# Patient Record
Sex: Female | Born: 1950 | Race: White | Hispanic: No | Marital: Married | Smoking: Never smoker
Health system: Southern US, Community
[De-identification: ages and names within clinical notes are randomized; demographics above are authoritative.]

## PROBLEM LIST (undated history)

## (undated) DIAGNOSIS — F0390 Unspecified dementia without behavioral disturbance: Secondary | ICD-10-CM

## (undated) DIAGNOSIS — G20 Parkinson's disease: Secondary | ICD-10-CM

## (undated) DIAGNOSIS — G20A1 Parkinson's disease without dyskinesia, without mention of fluctuations: Secondary | ICD-10-CM

## (undated) DIAGNOSIS — J45909 Unspecified asthma, uncomplicated: Secondary | ICD-10-CM

---

## 2004-12-29 ENCOUNTER — Ambulatory Visit: Payer: Self-pay | Admitting: Internal Medicine

## 2005-12-30 ENCOUNTER — Ambulatory Visit: Payer: Self-pay | Admitting: Internal Medicine

## 2007-01-19 ENCOUNTER — Ambulatory Visit: Payer: Self-pay | Admitting: Internal Medicine

## 2009-05-17 ENCOUNTER — Ambulatory Visit: Payer: Self-pay | Admitting: Internal Medicine

## 2010-05-19 ENCOUNTER — Ambulatory Visit: Payer: Self-pay | Admitting: Internal Medicine

## 2011-08-06 ENCOUNTER — Ambulatory Visit: Payer: Self-pay | Admitting: Internal Medicine

## 2011-08-17 ENCOUNTER — Ambulatory Visit: Payer: Self-pay | Admitting: Gastroenterology

## 2011-08-18 LAB — PATHOLOGY REPORT

## 2012-09-23 ENCOUNTER — Ambulatory Visit: Payer: Self-pay | Admitting: Internal Medicine

## 2013-09-25 ENCOUNTER — Ambulatory Visit: Payer: Self-pay | Admitting: Internal Medicine

## 2015-03-25 ENCOUNTER — Other Ambulatory Visit: Payer: Self-pay | Admitting: Internal Medicine

## 2015-03-25 DIAGNOSIS — Z1231 Encounter for screening mammogram for malignant neoplasm of breast: Secondary | ICD-10-CM

## 2015-04-04 ENCOUNTER — Ambulatory Visit: Payer: Self-pay

## 2015-04-17 ENCOUNTER — Ambulatory Visit
Admission: RE | Admit: 2015-04-17 | Discharge: 2015-04-17 | Disposition: A | Discharge status: Home / Self Care | Payer: PRIVATE HEALTH INSURANCE | Source: Ambulatory Visit | Attending: Internal Medicine | Admitting: Internal Medicine

## 2015-04-17 DIAGNOSIS — Z1231 Encounter for screening mammogram for malignant neoplasm of breast: Secondary | ICD-10-CM | POA: Diagnosis present

## 2015-09-27 ENCOUNTER — Emergency Department: Payer: PRIVATE HEALTH INSURANCE

## 2015-09-27 ENCOUNTER — Encounter: Payer: Self-pay | Admitting: Emergency Medicine

## 2015-09-27 ENCOUNTER — Emergency Department
Admission: EM | Admit: 2015-09-27 | Discharge: 2015-09-27 | Disposition: A | Discharge status: Home / Self Care | Payer: PRIVATE HEALTH INSURANCE | Attending: Emergency Medicine | Admitting: Emergency Medicine

## 2015-09-27 DIAGNOSIS — E785 Hyperlipidemia, unspecified: Secondary | ICD-10-CM | POA: Insufficient documentation

## 2015-09-27 DIAGNOSIS — M79604 Pain in right leg: Secondary | ICD-10-CM | POA: Diagnosis not present

## 2015-09-27 DIAGNOSIS — M109 Gout, unspecified: Secondary | ICD-10-CM | POA: Insufficient documentation

## 2015-09-27 DIAGNOSIS — F329 Major depressive disorder, single episode, unspecified: Secondary | ICD-10-CM | POA: Insufficient documentation

## 2015-09-27 DIAGNOSIS — N289 Disorder of kidney and ureter, unspecified: Secondary | ICD-10-CM

## 2015-09-27 DIAGNOSIS — Z880 Allergy status to penicillin: Secondary | ICD-10-CM | POA: Insufficient documentation

## 2015-09-27 DIAGNOSIS — I10 Essential (primary) hypertension: Secondary | ICD-10-CM | POA: Insufficient documentation

## 2015-09-27 DIAGNOSIS — F419 Anxiety disorder, unspecified: Secondary | ICD-10-CM | POA: Diagnosis not present

## 2015-09-27 HISTORY — DX: Unspecified asthma, uncomplicated: J45.909

## 2015-09-27 LAB — CBC WITH DIFFERENTIAL/PLATELET
BASOS ABS: 0.1 10*3/uL (ref 0–0.1)
Basophils Relative: 1 %
Eosinophils Absolute: 0.4 10*3/uL (ref 0–0.7)
Eosinophils Relative: 5 %
HEMATOCRIT: 38.4 % (ref 35.0–47.0)
HEMOGLOBIN: 13 g/dL (ref 12.0–16.0)
LYMPHS PCT: 35 %
Lymphs Abs: 2.9 10*3/uL (ref 1.0–3.6)
MCH: 30.3 pg (ref 26.0–34.0)
MCHC: 33.9 g/dL (ref 32.0–36.0)
MCV: 89.3 fL (ref 80.0–100.0)
Monocytes Absolute: 0.6 10*3/uL (ref 0.2–0.9)
Monocytes Relative: 7 %
NEUTROS ABS: 4.4 10*3/uL (ref 1.4–6.5)
Neutrophils Relative %: 52 %
Platelets: 196 10*3/uL (ref 150–440)
RBC: 4.3 MIL/uL (ref 3.80–5.20)
RDW: 13.6 % (ref 11.5–14.5)
WBC: 8.4 10*3/uL (ref 3.6–11.0)

## 2015-09-27 LAB — BASIC METABOLIC PANEL
ANION GAP: 7 (ref 5–15)
BUN: 24 mg/dL — ABNORMAL HIGH (ref 6–20)
CALCIUM: 9.4 mg/dL (ref 8.9–10.3)
CO2: 29 mmol/L (ref 22–32)
Chloride: 106 mmol/L (ref 101–111)
Creatinine, Ser: 1.2 mg/dL — ABNORMAL HIGH (ref 0.44–1.00)
GFR, EST AFRICAN AMERICAN: 54 mL/min — AB (ref >60)
GFR, EST NON AFRICAN AMERICAN: 46 mL/min — AB (ref >60)
Glucose, Bld: 90 mg/dL (ref 65–99)
POTASSIUM: 3.5 mmol/L (ref 3.5–5.1)
Sodium: 142 mmol/L (ref 135–145)

## 2015-09-27 MED ORDER — TRAMADOL HCL 50 MG PO TABS: 50.0000 mg | ORAL_TABLET | Freq: Four times a day (QID) | ORAL | Status: AC | PRN

## 2015-09-27 MED ORDER — TRAMADOL HCL 50 MG PO TABS
50.0000 mg | ORAL_TABLET | Freq: Once | ORAL | Status: AC
  Administered 2015-09-27: 50 mg via ORAL
  Filled 2015-09-27: qty 1

## 2015-09-27 NOTE — ED Notes (Addendum)
Pt c/o left leg pain is 2/10 when non-weight bearing. With weight on leg pain is 10/10. Pt states she denies any injury to leg that she knows of. C/o of most of the pain in her shin and runs up her leg at times.

## 2015-09-27 NOTE — ED Provider Notes (Signed)
[facility] Emergency Department Provider Note  ____________________________________________  Time seen: Approximately 10:51 AM  I have reviewed the triage vital signs and the nursing notes.   HISTORY  Chief Complaint Leg Pain    HPI Deborah Silva is a 64 y.o. female patient complaining of left leg pain which increases with weightbearing. Patient stated this is anterior left lower leg pain. Patient stated pain as a 2/10 when not weightbearing as soon sleeping but when she wakes up*walking it becomes a 10 over 10. Patient has is that his complaint going on for couple of months. Patient is not discussed this complaint cough family doctor. The only positive for measures for his complaint is a sitting or laying down. Patient denies any shortness of breath or chest pain with this complaint.  Past Medical History  Diagnosis Date  . Asthma     There are no active problems to display for this patient.   No past surgical history on file.  No current outpatient prescriptions on file.  Allergies Penicillins and Theophyllines  No family history on file.  Social History Social History  Substance Use Topics  . Smoking status: Never Smoker   . Smokeless tobacco: None  . Alcohol Use: No    Review of Systems Constitutional: No fever/chills Eyes: No visual changes. ENT: No sore throat. Cardiovascular: Denies chest pain. Respiratory: Denies shortness of breath. Gastrointestinal: No abdominal pain.  No nausea, no vomiting.  No diarrhea.  No constipation. Genitourinary: Negative for dysuria. Musculoskeletal: Negative for back pain. Skin: Negative for rash. Neurological: Negative for headaches, focal weakness or numbness. Psychiatric:Anxiety and depression. Endocrine:Hyperlipidemia, hypertension, and gout Hematological/Lymphatic: Allergic/Immunilogical: See medication list. 10-point ROS otherwise  negative.  ____________________________________________   PHYSICAL EXAM:  VITAL SIGNS: ED Triage Vitals  Enc Vitals Group     BP --      Pulse --      Resp --      Temp --      Temp src --      SpO2 --      Weight 09/27/15 1040 193 lb (87.544 kg)     Height 09/27/15 1040 5\' 4"  (1.626 m)     Head Cir --      Peak Flow --      Pain Score 09/27/15 1026 1     Pain Loc --      Pain Edu? --      Excl. in GC? --     Constitutional: Alert and oriented. Well appearing and in no acute distress. Eyes: Conjunctivae are normal. PERRL. EOMI. Head: Atraumatic. Nose: No congestion/rhinnorhea. Mouth/Throat: Mucous membranes are moist.  Oropharynx non-erythematous. Neck: No stridor.  No cervical spine tenderness to palpation. *Hematological/Lymphatic/Immunilogical: No cervical lymphadenopathy. **}Cardiovascular: Normal rate, regular rhythm. Grossly normal heart sounds.  Good peripheral circulation. Respiratory: Normal respiratory effort.  No retractions. Lungs CTAB. Gastrointestinal: Soft and nontender. No distention. No abdominal bruits. No CVA tenderness. Musculoskeletal: No lower extremity tenderness nor edema.  No joint effusions. Neurologic:  Normal speech and language. No gross focal neurologic deficits are appreciated. No gait instability. Skin:  Skin is warm, dry and intact. No rash noted. Psychiatric: Mood and affect are normal. Speech and behavior are normal.  ____________________________________________   LABS (all labs ordered are listed, but only abnormal results are displayed)  Labs Reviewed  BASIC METABOLIC PANEL - Abnormal; Notable for the following:    BUN 24 (*)    Creatinine, Ser 1.20 (*)    GFR calc  non Af Amer 46 (*)    GFR calc Af Amer 54 (*)    All other components within normal limits  CBC WITH DIFFERENTIAL/PLATELET   ____________________________________________  EKG   ____________________________________________  RADIOLOGY  No acute findings on  x-ray. ____________________________________________   PROCEDURES  Procedure(s) performed: None  Critical Care performed: No  ____________________________________________   INITIAL IMPRESSION / ASSESSMENT AND PLAN / ED COURSE  Pertinent labs & imaging results that were available during my care of the patient were reviewed by me and considered in my medical decision making (see chart for details).  Right leg pain and mild renal insufficiency. Discuss x-ray and lab results with patient. Patient will follow-up with scheduled doctor's appointment next week. Patient given a prescription for tramadol. ____________________________________________   FINAL CLINICAL IMPRESSION(S) / ED DIAGNOSES  Final diagnoses:  Leg pain, anterior, right  Renal insufficiency, mild       Joni Reining, PA-C 09/27/15 1201  Arnaldo Natal, MD 09/28/15 [phone]

## 2015-09-27 NOTE — ED Notes (Signed)
Leg pain with weigth bearing--shin area.  No injury recalled.

## 2016-04-07 ENCOUNTER — Other Ambulatory Visit: Payer: Self-pay | Admitting: Internal Medicine

## 2016-04-07 DIAGNOSIS — Z1231 Encounter for screening mammogram for malignant neoplasm of breast: Secondary | ICD-10-CM

## 2016-04-23 ENCOUNTER — Other Ambulatory Visit: Payer: Self-pay | Admitting: Internal Medicine

## 2016-04-23 ENCOUNTER — Ambulatory Visit
Admission: RE | Admit: 2016-04-23 | Discharge: 2016-04-23 | Disposition: A | Discharge status: Home / Self Care | Payer: Medicare Other | Source: Ambulatory Visit | Attending: Internal Medicine | Admitting: Internal Medicine

## 2016-04-23 DIAGNOSIS — Z1231 Encounter for screening mammogram for malignant neoplasm of breast: Secondary | ICD-10-CM

## 2016-06-26 ENCOUNTER — Other Ambulatory Visit: Payer: Self-pay | Admitting: Internal Medicine

## 2016-06-26 DIAGNOSIS — R270 Ataxia, unspecified: Secondary | ICD-10-CM

## 2016-07-14 ENCOUNTER — Ambulatory Visit
Admission: RE | Admit: 2016-07-14 | Discharge: 2016-07-14 | Disposition: A | Discharge status: Home / Self Care | Payer: Medicare Other | Source: Ambulatory Visit | Attending: Internal Medicine | Admitting: Internal Medicine

## 2016-07-14 DIAGNOSIS — R270 Ataxia, unspecified: Secondary | ICD-10-CM

## 2016-07-23 ENCOUNTER — Ambulatory Visit
Admission: RE | Admit: 2016-07-23 | Discharge: 2016-07-23 | Disposition: A | Discharge status: Home / Self Care | Payer: Medicare Other | Source: Ambulatory Visit | Attending: Internal Medicine | Admitting: Internal Medicine

## 2016-07-23 ENCOUNTER — Encounter: Payer: Self-pay | Admitting: Radiology

## 2016-07-23 ENCOUNTER — Other Ambulatory Visit
Admission: RE | Admit: 2016-07-23 | Discharge: 2016-07-23 | Disposition: A | Discharge status: Home / Self Care | Payer: Medicare Other | Source: Ambulatory Visit | Attending: Internal Medicine | Admitting: Internal Medicine

## 2016-07-23 DIAGNOSIS — I10 Essential (primary) hypertension: Secondary | ICD-10-CM | POA: Diagnosis present

## 2016-07-23 DIAGNOSIS — R251 Tremor, unspecified: Secondary | ICD-10-CM | POA: Diagnosis present

## 2016-07-23 DIAGNOSIS — R9082 White matter disease, unspecified: Secondary | ICD-10-CM | POA: Diagnosis not present

## 2016-07-23 DIAGNOSIS — R270 Ataxia, unspecified: Secondary | ICD-10-CM | POA: Diagnosis present

## 2016-07-23 LAB — CREATININE, SERUM
CREATININE: 1.1 mg/dL — AB (ref 0.44–1.00)
GFR calc Af Amer: 60 mL/min — ABNORMAL LOW (ref >60)
GFR calc non Af Amer: 52 mL/min — ABNORMAL LOW (ref >60)

## 2016-07-23 LAB — BUN: BUN: 21 mg/dL — ABNORMAL HIGH (ref 6–20)

## 2016-07-23 MED ORDER — GADOBENATE DIMEGLUMINE 529 MG/ML IV SOLN
20.0000 mL | Freq: Once | INTRAVENOUS | Status: AC | PRN
  Administered 2016-07-23: 18 mL via INTRAVENOUS

## 2017-06-25 ENCOUNTER — Other Ambulatory Visit: Payer: Self-pay | Admitting: Internal Medicine

## 2017-06-25 DIAGNOSIS — Z1231 Encounter for screening mammogram for malignant neoplasm of breast: Secondary | ICD-10-CM

## 2017-07-26 ENCOUNTER — Ambulatory Visit
Admission: RE | Admit: 2017-07-26 | Discharge: 2017-07-26 | Disposition: A | Discharge status: Home / Self Care | Payer: Medicare Other | Source: Ambulatory Visit | Attending: Internal Medicine | Admitting: Internal Medicine

## 2017-07-26 DIAGNOSIS — Z1231 Encounter for screening mammogram for malignant neoplasm of breast: Secondary | ICD-10-CM | POA: Insufficient documentation

## 2018-06-29 ENCOUNTER — Other Ambulatory Visit: Payer: Self-pay | Admitting: Internal Medicine

## 2018-06-29 DIAGNOSIS — Z1231 Encounter for screening mammogram for malignant neoplasm of breast: Secondary | ICD-10-CM

## 2018-08-01 ENCOUNTER — Ambulatory Visit
Admission: RE | Admit: 2018-08-01 | Discharge: 2018-08-01 | Disposition: A | Discharge status: Home / Self Care | Payer: Medicare Other | Source: Ambulatory Visit | Attending: Internal Medicine | Admitting: Internal Medicine

## 2018-08-01 DIAGNOSIS — Z1231 Encounter for screening mammogram for malignant neoplasm of breast: Secondary | ICD-10-CM | POA: Diagnosis not present

## 2018-10-14 ENCOUNTER — Emergency Department: Payer: Medicare Other

## 2018-10-14 ENCOUNTER — Emergency Department
Admission: EM | Admit: 2018-10-14 | Discharge: 2018-10-14 | Disposition: A | Discharge status: Home / Self Care | Payer: Medicare Other | Attending: Emergency Medicine | Admitting: Emergency Medicine

## 2018-10-14 DIAGNOSIS — R1110 Vomiting, unspecified: Secondary | ICD-10-CM | POA: Diagnosis present

## 2018-10-14 DIAGNOSIS — J45909 Unspecified asthma, uncomplicated: Secondary | ICD-10-CM | POA: Insufficient documentation

## 2018-10-14 LAB — COMPREHENSIVE METABOLIC PANEL
ALT: 17 U/L (ref 0–44)
ANION GAP: 6 (ref 5–15)
AST: 23 U/L (ref 15–41)
Albumin: 4 g/dL (ref 3.5–5.0)
Alkaline Phosphatase: 52 U/L (ref 38–126)
BILIRUBIN TOTAL: 0.8 mg/dL (ref 0.3–1.2)
BUN: 22 mg/dL (ref 8–23)
CALCIUM: 8.7 mg/dL — AB (ref 8.9–10.3)
CO2: 26 mmol/L (ref 22–32)
CREATININE: 0.9 mg/dL (ref 0.44–1.00)
Chloride: 106 mmol/L (ref 98–111)
Glucose, Bld: 132 mg/dL — ABNORMAL HIGH (ref 70–99)
Potassium: 3.8 mmol/L (ref 3.5–5.1)
Sodium: 138 mmol/L (ref 135–145)
TOTAL PROTEIN: 6.8 g/dL (ref 6.5–8.1)

## 2018-10-14 LAB — CBC
HCT: 38.4 % (ref 36.0–46.0)
Hemoglobin: 12.4 g/dL (ref 12.0–15.0)
MCH: 29.5 pg (ref 26.0–34.0)
MCHC: 32.3 g/dL (ref 30.0–36.0)
MCV: 91.4 fL (ref 80.0–100.0)
NRBC: 0 % (ref 0.0–0.2)
PLATELETS: 155 10*3/uL (ref 150–400)
RBC: 4.2 MIL/uL (ref 3.87–5.11)
RDW: 13.5 % (ref 11.5–15.5)
WBC: 7.2 10*3/uL (ref 4.0–10.5)

## 2018-10-14 LAB — TROPONIN I

## 2018-10-14 LAB — MAGNESIUM: Magnesium: 2.2 mg/dL (ref 1.7–2.4)

## 2018-10-14 LAB — LIPASE, BLOOD: Lipase: 41 U/L (ref 11–51)

## 2018-10-14 MED ORDER — ONDANSETRON 4 MG PO TBDP: ORAL_TABLET | ORAL | 0 refills | Status: DC

## 2018-10-14 NOTE — ED Provider Notes (Signed)
[facility] Emergency Department Provider Note  ____________________________________________   First MD Initiated Contact with Patient 10/14/18 [phone]     (approximate)  I have reviewed the triage vital signs and the nursing notes.   HISTORY  Chief Complaint No chief complaint on file.    HPI Deborah Silva is a 67 y.o. female with no contributory past medical history who presents for evaluation of persistent vomiting.  She reports that the onset was acute and nothing in particular makes it better or worse other than trying to eat or drink anything.  She denies having any chest pain or shortness of breath.  She denies recent fever/chills and even denies abdominal pain in spite of the nausea and vomiting.  She has also had no recent dysuria.  She has not had a history of bowel obstruction or ileus.  She reports that she has had some heartburn for several days that has been coming and going but has not been persistent and she currently does not feel any.  She is feeling better after 4 mg of Zofran IV.  The now her vomiting were severe but have improved significantly.  Past Medical History:  Diagnosis Date  . Asthma     There are no active problems to display for this patient.   History reviewed. No pertinent surgical history.  Prior to Admission medications   Medication Sig Start Date End Date Taking? Authorizing Provider  ondansetron (ZOFRAN ODT) 4 MG disintegrating tablet Allow 1-2 tablets to dissolve in your mouth every 8 hours as needed for nausea/vomiting 10/14/18   Loleta Rose, MD    Allergies Penicillins and Theophyllines  Family History  Problem Relation Age of Onset  . Breast cancer Neg Hx     Social History Social History   Tobacco Use  . Smoking status: Never Smoker  . Smokeless tobacco: Never Used  Substance Use Topics  . Alcohol use: No  . Drug use: Not on file    Review of Systems Constitutional: No fever/chills Eyes: No  visual changes. ENT: No sore throat. Cardiovascular: Denies chest pain. Respiratory: Denies shortness of breath. Gastrointestinal: No abdominal pain.  Nausea and vomiting since last night.  No diarrhea.  No constipation. Genitourinary: Negative for dysuria. Musculoskeletal: Negative for neck pain.  Negative for back pain. Integumentary: Negative for rash. Neurological: Negative for headaches, focal weakness or numbness.   ____________________________________________   PHYSICAL EXAM:  VITAL SIGNS: ED Triage Vitals  Enc Vitals Group     BP 10/14/18 0409 (!) 158/67     Pulse Rate 10/14/18 0409 80     Resp 10/14/18 0409 18     Temp 10/14/18 0409 98.1 F (36.7 C)     Temp Source 10/14/18 0409 Oral     SpO2 10/14/18 0409 95 %     Weight 10/14/18 0410 88 kg (194 lb 0.1 oz)     Height --      Head Circumference --      Peak Flow --      Pain Score 10/14/18 0410 0     Pain Loc --      Pain Edu? --      Excl. in GC? --     Constitutional: Alert and oriented. Well appearing and in no acute distress. Eyes: Conjunctivae are normal.  Head: Atraumatic. Nose: No congestion/rhinnorhea. Mouth/Throat: Mucous membranes are moist. Neck: No stridor.  No meningeal signs.   Cardiovascular: Normal rate, regular rhythm. Good peripheral circulation. Grossly normal heart sounds.  Respiratory: Normal respiratory effort.  No retractions. Lungs CTAB. Gastrointestinal: Soft and nontender. No distention.  Musculoskeletal: No lower extremity tenderness nor edema. No gross deformities of extremities. Neurologic:  Normal speech and language. No gross focal neurologic deficits are appreciated.  Skin:  Skin is warm, dry and intact. No rash noted. Psychiatric: Mood and affect are normal. Speech and behavior are normal.  ____________________________________________   LABS (all labs ordered are listed, but only abnormal results are displayed)  Labs Reviewed  COMPREHENSIVE METABOLIC PANEL - Abnormal;  Notable for the following components:      Result Value   Glucose, Bld 132 (*)    Calcium 8.7 (*)    All other components within normal limits  LIPASE, BLOOD  CBC  TROPONIN I  MAGNESIUM  URINALYSIS, COMPLETE (UACMP) WITH MICROSCOPIC   ____________________________________________  EKG  ED ECG REPORT #1 I, Loleta Rose, the attending physician, personally viewed and interpreted this ECG.  Date: 10/14/2018 EKG Time: 4:16 AM Rate: 92 Rhythm: Atrial  with occasional PVC QRS Axis: Left axis deviation Intervals: normal ST/T Wave abnormalities: Non-specific ST segment / T-wave changes, but no evidence of acute ischemia. Narrative Interpretation: no evidence of acute ischemia  ED ECG REPORT #2 I, Loleta Rose, the attending physician, personally viewed and interpreted this ECG.  Date: 10/14/2018 EKG Time: 05:07 Rate: 76 Rhythm: normal sinus rhythm QRS Axis: normal Intervals: normal ST/T Wave abnormalities: Non-specific ST segment / T-wave changes, but no evidence of acute ischemia. Narrative Interpretation: no evidence of acute ischemia    ____________________________________________  RADIOLOGY I, Loleta Rose, personally viewed and evaluated these images (plain radiographs) as part of my medical decision making, as well as reviewing the written report by the radiologist.  ED MD interpretation: No active disease or acute intrathoracic abnormality  Official radiology report(s): Dg Chest Portable 1 View  Result Date: 10/14/2018 CLINICAL DATA:  67 y/o F; multiple emesis beginning last night. Heartburn. EXAM: PORTABLE CHEST 1 VIEW COMPARISON:  None. FINDINGS: The heart size and mediastinal contours are within normal limits. Both lungs are clear. The visualized skeletal structures are unremarkable. IMPRESSION: No active disease. Electronically Signed   By: Mitzi Hansen M.D.   On: 10/14/2018 05:39   Dg Abd 2 Views  Result Date: 10/14/2018 CLINICAL DATA:  Vomiting  EXAM: ABDOMEN - 2 VIEW COMPARISON:  None. FINDINGS: The bowel gas pattern is normal. There is no evidence of free air. No radio-opaque calculi or other significant radiographic abnormality is seen. IMPRESSION: Negative. Electronically Signed   By: Marlan Palau M.D.   On: 10/14/2018 06:38    ____________________________________________   PROCEDURES  Critical Care performed: No   Procedure(s) performed:   Procedures   ____________________________________________   INITIAL IMPRESSION / ASSESSMENT AND PLAN / ED COURSE  As part of my medical decision making, I reviewed the following data within the electronic MEDICAL RECORD NUMBER Nursing notes reviewed and incorporated, Labs reviewed , EKG interpreted , Old EKG reviewed, Old chart reviewed and Notes from prior ED visits    Differential diagnosis includes, but is not limited to, viral gastritis, foodborne infection, SBO/ileus, ACS, pancreatitis.  The patient felt much better after Zofran and was laughing and joking with me.  Her vital signs have been stable the whole time with no tachycardia no evidence of volume depletion in spite of her symptoms.  I obtained radiographs of chest and abdomen and there are no signs of dilated bowel loops or air-fluid levels.  I observed her for 3 to 4  hours and she had no recurrence of her symptoms and was able to sleep comfortably and was feeling well when she awoke.  I consider getting a repeat troponin I explained to the patient and her husband why I was discussing it, but they are comfortable not checking a second troponin and she just wants to go home to sleep.  She assures me she will follow-up with her regular doctor and return to the hospital if her symptoms get worse, and I think that is appropriate.  I gave strict return precautions and they understand and agree with the plan.  I provided a prescription for Zofran.     ____________________________________________  FINAL CLINICAL IMPRESSION(S) / ED  DIAGNOSES  Final diagnoses:  Vomiting     MEDICATIONS GIVEN DURING THIS VISIT:  Medications - No data to display   ED Discharge Orders         Ordered    ondansetron (ZOFRAN ODT) 4 MG disintegrating tablet     10/14/18 0745           Note:  This document was prepared using Dragon voice recognition software and may include unintentional dictation errors.    Loleta RoseForbach, Palak Tercero, MD 10/14/18 [phone]

## 2018-10-14 NOTE — ED Notes (Signed)
Portable chest XR at bedside

## 2018-10-14 NOTE — ED Triage Notes (Signed)
Patient c/o multiple emeses beginning last night. Patient denies diarrhea. Patient denies pain.   Patient reports heartburn X several days - denies pain currently.

## 2018-10-14 NOTE — Discharge Instructions (Addendum)
Your workup in the Emergency Department today was reassuring.  We did not find any specific abnormalities.  We recommend you drink plenty of fluids, take your regular medications and/or any new ones prescribed today, and follow up with the doctor(s) listed in these documents as recommended.  Return to the Emergency Department if you develop new or worsening symptoms that concern you, including but not limited to persistent vomiting, chest pain, shortness of breath, palpitations, etc.

## 2018-10-14 NOTE — ED Notes (Signed)
Pt off the floor at this time in XR.

## 2018-10-14 NOTE — ED Notes (Signed)
Introduced to patient, initial assessment completed. Pt placed on cardiac, NIB, and SPO2 monitoring. Pt updated on POC, call bell within reach, husband at bedside.

## 2019-03-24 ENCOUNTER — Other Ambulatory Visit: Payer: Self-pay | Admitting: Internal Medicine

## 2019-03-24 ENCOUNTER — Other Ambulatory Visit (HOSPITAL_COMMUNITY): Payer: Self-pay | Admitting: Internal Medicine

## 2019-03-24 DIAGNOSIS — G20 Parkinson's disease: Secondary | ICD-10-CM

## 2019-03-24 DIAGNOSIS — R441 Visual hallucinations: Secondary | ICD-10-CM

## 2019-03-24 DIAGNOSIS — R410 Disorientation, unspecified: Secondary | ICD-10-CM

## 2019-03-29 ENCOUNTER — Other Ambulatory Visit: Payer: Self-pay

## 2019-03-29 ENCOUNTER — Ambulatory Visit
Admission: RE | Admit: 2019-03-29 | Discharge: 2019-03-29 | Disposition: A | Discharge status: Home / Self Care | Payer: Medicare Other | Source: Ambulatory Visit | Attending: Internal Medicine | Admitting: Internal Medicine

## 2019-03-29 DIAGNOSIS — G20 Parkinson's disease: Secondary | ICD-10-CM | POA: Diagnosis not present

## 2019-03-29 DIAGNOSIS — R410 Disorientation, unspecified: Secondary | ICD-10-CM

## 2019-03-29 DIAGNOSIS — R441 Visual hallucinations: Secondary | ICD-10-CM | POA: Diagnosis present

## 2019-05-17 ENCOUNTER — Emergency Department: Payer: Medicare Other

## 2019-05-17 ENCOUNTER — Other Ambulatory Visit: Payer: Self-pay

## 2019-05-17 ENCOUNTER — Inpatient Hospital Stay
Admission: EM | Admit: 2019-05-17 | Discharge: 2019-05-22 | DRG: 057 | Disposition: A | Discharge status: Home / Self Care | Payer: Medicare Other | Attending: Internal Medicine | Admitting: Internal Medicine

## 2019-05-17 DIAGNOSIS — G20 Parkinson's disease: Secondary | ICD-10-CM | POA: Diagnosis not present

## 2019-05-17 DIAGNOSIS — R531 Weakness: Secondary | ICD-10-CM | POA: Diagnosis not present

## 2019-05-17 DIAGNOSIS — Z1159 Encounter for screening for other viral diseases: Secondary | ICD-10-CM

## 2019-05-17 DIAGNOSIS — Z880 Allergy status to penicillin: Secondary | ICD-10-CM

## 2019-05-17 DIAGNOSIS — F0391 Unspecified dementia with behavioral disturbance: Secondary | ICD-10-CM | POA: Diagnosis present

## 2019-05-17 DIAGNOSIS — Z79891 Long term (current) use of opiate analgesic: Secondary | ICD-10-CM

## 2019-05-17 DIAGNOSIS — G4700 Insomnia, unspecified: Secondary | ICD-10-CM | POA: Diagnosis present

## 2019-05-17 DIAGNOSIS — R262 Difficulty in walking, not elsewhere classified: Secondary | ICD-10-CM | POA: Diagnosis present

## 2019-05-17 DIAGNOSIS — Z79899 Other long term (current) drug therapy: Secondary | ICD-10-CM

## 2019-05-17 DIAGNOSIS — E876 Hypokalemia: Secondary | ICD-10-CM | POA: Diagnosis present

## 2019-05-17 DIAGNOSIS — Z888 Allergy status to other drugs, medicaments and biological substances status: Secondary | ICD-10-CM

## 2019-05-17 DIAGNOSIS — Z886 Allergy status to analgesic agent status: Secondary | ICD-10-CM

## 2019-05-17 DIAGNOSIS — R339 Retention of urine, unspecified: Secondary | ICD-10-CM | POA: Diagnosis present

## 2019-05-17 HISTORY — DX: Parkinson's disease without dyskinesia, without mention of fluctuations: G20.A1

## 2019-05-17 HISTORY — DX: Parkinson's disease: G20

## 2019-05-17 HISTORY — DX: Unspecified dementia, unspecified severity, without behavioral disturbance, psychotic disturbance, mood disturbance, and anxiety: F03.90

## 2019-05-17 LAB — COMPREHENSIVE METABOLIC PANEL
ALT: 17 U/L (ref 0–44)
AST: 21 U/L (ref 15–41)
Albumin: 3.9 g/dL (ref 3.5–5.0)
Alkaline Phosphatase: 63 U/L (ref 38–126)
Anion gap: 9 (ref 5–15)
BUN: 23 mg/dL (ref 8–23)
CO2: 29 mmol/L (ref 22–32)
Calcium: 9.1 mg/dL (ref 8.9–10.3)
Chloride: 103 mmol/L (ref 98–111)
Creatinine, Ser: 1.14 mg/dL — ABNORMAL HIGH (ref 0.44–1.00)
GFR calc Af Amer: 57 mL/min — ABNORMAL LOW (ref >60)
GFR calc non Af Amer: 49 mL/min — ABNORMAL LOW (ref >60)
Glucose, Bld: 109 mg/dL — ABNORMAL HIGH (ref 70–99)
Potassium: 3.3 mmol/L — ABNORMAL LOW (ref 3.5–5.1)
Sodium: 141 mmol/L (ref 135–145)
Total Bilirubin: 0.6 mg/dL (ref 0.3–1.2)
Total Protein: 6.8 g/dL (ref 6.5–8.1)

## 2019-05-17 LAB — CBC WITH DIFFERENTIAL/PLATELET
Abs Immature Granulocytes: 0.02 10*3/uL (ref 0.00–0.07)
Basophils Absolute: 0.1 10*3/uL (ref 0.0–0.1)
Basophils Relative: 1 %
Eosinophils Absolute: 0.2 10*3/uL (ref 0.0–0.5)
Eosinophils Relative: 3 %
HCT: 35.1 % — ABNORMAL LOW (ref 36.0–46.0)
Hemoglobin: 11.6 g/dL — ABNORMAL LOW (ref 12.0–15.0)
Immature Granulocytes: 0 %
Lymphocytes Relative: 33 %
Lymphs Abs: 2.3 10*3/uL (ref 0.7–4.0)
MCH: 30 pg (ref 26.0–34.0)
MCHC: 33 g/dL (ref 30.0–36.0)
MCV: 90.7 fL (ref 80.0–100.0)
Monocytes Absolute: 0.6 10*3/uL (ref 0.1–1.0)
Monocytes Relative: 8 %
Neutro Abs: 3.9 10*3/uL (ref 1.7–7.7)
Neutrophils Relative %: 55 %
Platelets: 195 10*3/uL (ref 150–400)
RBC: 3.87 MIL/uL (ref 3.87–5.11)
RDW: 14.1 % (ref 11.5–15.5)
WBC: 7.1 10*3/uL (ref 4.0–10.5)
nRBC: 0 % (ref 0.0–0.2)

## 2019-05-17 LAB — TSH: TSH: 3.219 u[IU]/mL (ref 0.350–4.500)

## 2019-05-17 LAB — URINALYSIS, COMPLETE (UACMP) WITH MICROSCOPIC
Bacteria, UA: NONE SEEN
Bilirubin Urine: NEGATIVE
Glucose, UA: NEGATIVE mg/dL
Hgb urine dipstick: NEGATIVE
Ketones, ur: NEGATIVE mg/dL
Leukocytes,Ua: NEGATIVE
Nitrite: NEGATIVE
Protein, ur: NEGATIVE mg/dL
Specific Gravity, Urine: 1.025 (ref 1.005–1.030)
pH: 5 (ref 5.0–8.0)

## 2019-05-17 LAB — TROPONIN I (HIGH SENSITIVITY)
Troponin I (High Sensitivity): 16 ng/L (ref <18)
Troponin I (High Sensitivity): 9 ng/L (ref <18)

## 2019-05-17 MED ORDER — LORAZEPAM 2 MG/ML IJ SOLN
1.0000 mg | Freq: Once | INTRAMUSCULAR | Status: AC
  Administered 2019-05-17: 1 mg via INTRAVENOUS
  Filled 2019-05-17: qty 1

## 2019-05-17 NOTE — ED Notes (Signed)
Called pt's husband [phone], to inform pt is going to be admitted, Mr. Allebach asked about pt's room RN informed in process to get admitted once to let him know what room pt is

## 2019-05-17 NOTE — ED Notes (Signed)
Pt having crying outburst to the point that you cannot understand what she is saying - she keeps asking to call her husband and I promised to call as soon as she returned from CT

## 2019-05-17 NOTE — ED Notes (Signed)
Updated family on pt condition and plan of care

## 2019-05-17 NOTE — ED Notes (Signed)
Pt's husband called updated

## 2019-05-17 NOTE — ED Notes (Signed)
This Rn and Romie Minus attempted to get pt to walk, pt able to stand but unable to give any steps. Pt sleepy unable to keep eyes open, able to follow directions, pt is calm no distress noted

## 2019-05-17 NOTE — ED Notes (Addendum)
Spoke with pt spouse Rush Landmark [phone] He reports that pt has had increased weakness over the last week that progressed to the point of not being able to ambulate unassisted today He also reports that the patient has "crying spells" and that is part of her dementia

## 2019-05-17 NOTE — ED Notes (Signed)
Pt back from CT

## 2019-05-17 NOTE — ED Triage Notes (Signed)
Pt arrived via ems for report of weakness and decreased sleep - pt has hx of parkinson and dementia with sundowners - pt can normally ambulate unassisted but starting yesterday she cannot

## 2019-05-17 NOTE — ED Notes (Signed)
Bubba Camp pt's son check on pt. [phone] updated on pt's condition

## 2019-05-17 NOTE — ED Notes (Signed)
Patient transported to MRI 

## 2019-05-17 NOTE — ED Provider Notes (Addendum)
[facility] Emergency Department Provider Note   ____________________________________________   First MD Initiated Contact with Patient 05/17/19 1637     (approximate)  I have reviewed the triage vital signs and the nursing notes.   HISTORY  Chief Complaint Weakness   HP Hosie PoissonMary A Prada is a 68 y.o. female who has Parkinson's disease.  EMS reports that her husband has said that he has noticed a rapid decreased in her functioning in the last few weeks.  She has not is with that as usual.  Additionally she is weaker than usual and has been unable to ambulate well starting yesterday.  EMS reports it took 3 people to walk her to the stretcher.  Patient herself only complains of inability to sleep well she is not quite sure why.  Patient is very slow to respond to questions and commands.  She has no apparent pain.        Past Medical History:  Diagnosis Date   Asthma    Dementia (HCC)    Parkinson disease (HCC)     There are no active problems to display for this patient.   History reviewed. No pertinent surgical history.  Prior to Admission medications   Medication Sig Start Date End Date Taking? Authorizing Provider  allopurinol (ZYLOPRIM) 100 MG tablet Take 100 mg by mouth daily. 03/13/19  Yes [provider]  atorvastatin (LIPITOR) 40 MG tablet Take 40 mg by mouth daily. 03/29/19  Yes [provider]  doxepin (SINEQUAN) 10 MG capsule Take 10 mg by mouth at bedtime as needed. 05/10/19  Yes [provider]  escitalopram (LEXAPRO) 20 MG tablet Take 20 mg by mouth daily. 03/24/19  Yes [provider]  Fluticasone-Salmeterol (WIXELA INHUB) 250-50 MCG/DOSE AEPB Inhale 1 puff into the lungs every 12 (twelve) hours. 07/18/18  Yes [provider]  furosemide (LASIX) 20 MG tablet Take 20 mg by mouth daily. 03/05/19  Yes [provider]  metoprolol tartrate (LOPRESSOR) 25 MG tablet Take 25 mg by mouth 2 (two) times  daily. 04/28/19  Yes [provider]  potassium chloride (K-DUR) 10 MEQ tablet Take 10 mEq by mouth 2 (two) times a day. 08/08/18 08/08/19 Yes [provider]  QUEtiapine (SEROQUEL) 25 MG tablet Take 25 mg by mouth at bedtime. 04/17/19  Yes [provider]  traZODone (DESYREL) 100 MG tablet Take 100 mg by mouth Nightly. 05/11/19 06/10/19 Yes [provider]    Allergies Naproxen sodium, Aminophylline, Penicillins, and Theophyllines  Family History  Problem Relation Age of Onset   Breast cancer Neg Hx     Social History Social History   Tobacco Use   Smoking status: Never Smoker   Smokeless tobacco: Never Used  Substance Use Topics   Alcohol use: No   Drug use: Never    Review of Systems  Constitutional: No fever/chills Eyes: No visual changes. ENT: No sore throat. Cardiovascular: Denies chest pain. Respiratory: Denies shortness of breath. Gastrointestinal: No abdominal pain.  No nausea, no vomiting.  No diarrhea.  No constipation. Genitourinary: Negative for dysuria. Musculoskeletal: Patient does complain of some back pain with sitting. Skin: Negative for rash. Neurological: Negative for headaches, focal weakness or   ____________________________________________   PHYSICAL EXAM:  VITAL SIGNS: ED Triage Vitals  Enc Vitals Group     BP 05/17/19 1628 133/73     Pulse Rate 05/17/19 1628 68     Resp 05/17/19 1628 (!) 23     Temp --  Temp src --      SpO2 05/17/19 1628 95 %     Weight 05/17/19 1628 179 lb (81.2 kg)     Height 05/17/19 1628 5\' 3"  (1.6 m)     Head Circumference --      Peak Flow --      Pain Score 05/17/19 1620 5     Pain Loc --      Pain Edu? --      Excl. in GC? --     Constitutional: Alert and oriented. Well appearing and in no acute distress. Eyes: Conjunctivae are normal.  Head: Atraumatic. Nose: No congestion/rhinnorhea. Mouth/Throat: Mucous membranes are moist.  Oropharynx non-erythematous. Neck:  No stridor.  Cardiovascular: Normal rate, regular rhythm. Grossly normal heart sounds.  Good peripheral circulation. Respiratory: Normal respiratory effort.  No retractions. Lungs CTAB. Gastrointestinal: Soft and nontender. No distention. No abdominal bruits. No CVA tenderness. Musculoskeletal: Bilateral lower leg edema.  About 1+ to the knees.  Additionally there is redness around both ankles.  This is warm and slightly tender. Neurologic: Speech is slowed but accurate.  Motor strength seems to be a little bit weaker on the left more so in the arm and the leg with both arm and leg are weak.  Patient has difficulty lifting both legs off the stretcher.  She says they feel heavy and tight.  Patient is unable to complete finger-to-nose on the left because she cannot lift her hand up high enough to touch my finger although she can do so on the right.  Grip seems to be a little bit weaker on the left as well Skin:  Skin is warm, dry and intact redness as noted above   ____________________________________________   LABS (all labs ordered are listed, but only abnormal results are displayed)  Labs Reviewed  COMPREHENSIVE METABOLIC PANEL - Abnormal; Notable for the following components:      Result Value   Potassium 3.3 (*)    Glucose, Bld 109 (*)    Creatinine, Ser 1.14 (*)    GFR calc non Af Amer 49 (*)    GFR calc Af Amer 57 (*)    All other components within normal limits  CBC WITH DIFFERENTIAL/PLATELET - Abnormal; Notable for the following components:   Hemoglobin 11.6 (*)    HCT 35.1 (*)    All other components within normal limits  TSH  URINALYSIS, COMPLETE (UACMP) WITH MICROSCOPIC  TROPONIN I (HIGH SENSITIVITY)  TROPONIN I (HIGH SENSITIVITY)   ____________________________________________  EKG  EKG read interpreted by me shows atrial flutter at a rate of 68 normal axis no acute ST-T wave changes patient has had a flutter before in December  2019 ____________________________________________  RADIOLOGY  ED MD interpretation:    Official radiology report(s): Ct Head Wo Contrast  Result Date: 05/17/2019 CLINICAL DATA:  Right-sided weakness and lethargy. Reported history of Parkinson's disease and dementia EXAM: CT HEAD WITHOUT CONTRAST TECHNIQUE: Contiguous axial images were obtained from the base of the skull through the vertex without intravenous contrast. COMPARISON:  Mar 29, 2019 FINDINGS: Brain: Ventricles and sulci are within normal limits for age. There is no intracranial mass, hemorrhage, extra-axial fluid collection, or midline shift. The brain parenchyma appears unremarkable. No acute infarct is demonstrable. Vascular: There is no hyperdense vessel. There is calcification in each carotid siphon region. Skull: The bony calvarium appears intact. Sinuses/Orbits: Visualized paranasal sinuses are clear. Orbits appear symmetric bilaterally. Other: Mastoid air cells are clear. IMPRESSION: Brain parenchyma appears unremarkable. No acute  infarct. No mass or hemorrhage. There are foci of arterial vascular calcification. Electronically Signed   By: Lowella Grip III M.D.   On: 05/17/2019 17:04   Mr Brain Wo Contrast  Result Date: 05/17/2019 CLINICAL DATA:  Weakness.  Dementia.  Sleep disturbance. EXAM: MRI HEAD WITHOUT CONTRAST TECHNIQUE: Multiplanar, multiecho pulse sequences of the brain and surrounding structures were obtained without intravenous contrast. COMPARISON:  Head CT same day FINDINGS: Brain: Diffusion imaging does not show any acute or subacute infarction. No abnormality is seen affecting the brainstem or cerebellum. Cerebral hemispheres show mild chronic small-vessel ischemic changes of the white matter. No cortical or large vessel territory infarction. No mass lesion, hemorrhage, hydrocephalus or extra-axial collection. Vascular: Major vessels at the base of the brain show flow. Skull and upper cervical spine: Negative  Sinuses/Orbits: Clear/normal Other: None IMPRESSION: No acute or reversible finding. Mild chronic small-vessel ischemic change of the cerebral hemispheric white matter. Electronically Signed   By: Nelson Chimes M.D.   On: 05/17/2019 20:37   Dg Chest Portable 1 View  Result Date: 05/17/2019 CLINICAL DATA:  Lethargy.  Dementia. EXAM: PORTABLE CHEST 1 VIEW COMPARISON:  October 14, 2018 FINDINGS: There is no edema or consolidation. Heart is borderline enlarged with pulmonary vascularity normal. No adenopathy. No bone lesions. IMPRESSION: Borderline cardiac enlargement.  No edema or consolidation. Electronically Signed   By: Lowella Grip III M.D.   On: 05/17/2019 17:23    ____________________________________________   PROCEDURES  Procedure(s) performed (including Critical Care):  Procedures   ____________________________________________   INITIAL IMPRESSION / ASSESSMENT AND PLAN / ED COURSE  Patient's MRI is negative.  Patient is demented but really unable to walk at this point.  I will try to get her in the hospital and see if we can elucidate the cause of her rapid decline and inability to walk today.  Husband does want her to come home.  If we cannot make her better will at least have to get him home health PT.   Patient's husband had said that she cries often in the evening uncontrollably till she goes to sleep.  She was doing that here.          ____________________________________________   FINAL CLINICAL IMPRESSION(S) / ED DIAGNOSES  Final diagnoses:  Weakness  Dementia with behavioral disturbance, unspecified dementia type [facility])     ED Discharge Orders    None       Note:  This document was prepared using Dragon voice recognition software and may include unintentional dictation errors.    Nena Polio, MD 05/17/19 2152    Nena Polio, MD 05/17/19 2157

## 2019-05-18 ENCOUNTER — Other Ambulatory Visit: Payer: Self-pay

## 2019-05-18 DIAGNOSIS — R531 Weakness: Secondary | ICD-10-CM | POA: Diagnosis present

## 2019-05-18 DIAGNOSIS — G20 Parkinson's disease: Secondary | ICD-10-CM | POA: Diagnosis present

## 2019-05-18 DIAGNOSIS — F0391 Unspecified dementia with behavioral disturbance: Secondary | ICD-10-CM | POA: Diagnosis present

## 2019-05-18 DIAGNOSIS — R339 Retention of urine, unspecified: Secondary | ICD-10-CM | POA: Diagnosis present

## 2019-05-18 DIAGNOSIS — Z880 Allergy status to penicillin: Secondary | ICD-10-CM | POA: Diagnosis not present

## 2019-05-18 DIAGNOSIS — R262 Difficulty in walking, not elsewhere classified: Secondary | ICD-10-CM | POA: Diagnosis present

## 2019-05-18 DIAGNOSIS — Z886 Allergy status to analgesic agent status: Secondary | ICD-10-CM | POA: Diagnosis not present

## 2019-05-18 DIAGNOSIS — E876 Hypokalemia: Secondary | ICD-10-CM | POA: Diagnosis present

## 2019-05-18 DIAGNOSIS — Z888 Allergy status to other drugs, medicaments and biological substances status: Secondary | ICD-10-CM | POA: Diagnosis not present

## 2019-05-18 DIAGNOSIS — Z79891 Long term (current) use of opiate analgesic: Secondary | ICD-10-CM | POA: Diagnosis not present

## 2019-05-18 DIAGNOSIS — G4700 Insomnia, unspecified: Secondary | ICD-10-CM | POA: Diagnosis present

## 2019-05-18 DIAGNOSIS — Z1159 Encounter for screening for other viral diseases: Secondary | ICD-10-CM | POA: Diagnosis not present

## 2019-05-18 DIAGNOSIS — Z79899 Other long term (current) drug therapy: Secondary | ICD-10-CM | POA: Diagnosis not present

## 2019-05-18 LAB — CBC
HCT: 30.4 % — ABNORMAL LOW (ref 36.0–46.0)
Hemoglobin: 10 g/dL — ABNORMAL LOW (ref 12.0–15.0)
MCH: 30 pg (ref 26.0–34.0)
MCHC: 32.9 g/dL (ref 30.0–36.0)
MCV: 91.3 fL (ref 80.0–100.0)
Platelets: 180 10*3/uL (ref 150–400)
RBC: 3.33 MIL/uL — ABNORMAL LOW (ref 3.87–5.11)
RDW: 14 % (ref 11.5–15.5)
WBC: 6.3 10*3/uL (ref 4.0–10.5)
nRBC: 0 % (ref 0.0–0.2)

## 2019-05-18 LAB — BASIC METABOLIC PANEL
Anion gap: 9 (ref 5–15)
BUN: 21 mg/dL (ref 8–23)
CO2: 30 mmol/L (ref 22–32)
Calcium: 8.6 mg/dL — ABNORMAL LOW (ref 8.9–10.3)
Chloride: 104 mmol/L (ref 98–111)
Creatinine, Ser: 0.99 mg/dL (ref 0.44–1.00)
GFR calc Af Amer: >60 mL/min (ref >60)
GFR calc non Af Amer: 59 mL/min — ABNORMAL LOW (ref >60)
Glucose, Bld: 109 mg/dL — ABNORMAL HIGH (ref 70–99)
Potassium: 2.5 mmol/L — CL (ref 3.5–5.1)
Sodium: 143 mmol/L (ref 135–145)

## 2019-05-18 LAB — SARS CORONAVIRUS 2 BY RT PCR (HOSPITAL ORDER, PERFORMED IN ~~LOC~~ HOSPITAL LAB): SARS Coronavirus 2: NEGATIVE

## 2019-05-18 LAB — TSH: TSH: 2.444 u[IU]/mL (ref 0.350–4.500)

## 2019-05-18 LAB — POTASSIUM: Potassium: 3.7 mmol/L (ref 3.5–5.1)

## 2019-05-18 LAB — MAGNESIUM: Magnesium: 2 mg/dL (ref 1.7–2.4)

## 2019-05-18 LAB — GLUCOSE, CAPILLARY: Glucose-Capillary: 152 mg/dL — ABNORMAL HIGH (ref 70–99)

## 2019-05-18 MED ORDER — MOMETASONE FURO-FORMOTEROL FUM 200-5 MCG/ACT IN AERO
2.0000 | INHALATION_SPRAY | Freq: Two times a day (BID) | RESPIRATORY_TRACT | Status: DC
  Administered 2019-05-18 – 2019-05-22 (×8): 2 via RESPIRATORY_TRACT
  Filled 2019-05-18: qty 8.8

## 2019-05-18 MED ORDER — SODIUM CHLORIDE 0.9% FLUSH: 3.0000 mL | INTRAVENOUS | Status: DC | PRN

## 2019-05-18 MED ORDER — BISACODYL 5 MG PO TBEC: 5.0000 mg | DELAYED_RELEASE_TABLET | Freq: Every day | ORAL | Status: DC | PRN

## 2019-05-18 MED ORDER — MAGNESIUM SULFATE 2 GM/50ML IV SOLN
2.0000 g | Freq: Once | INTRAVENOUS | Status: DC
  Filled 2019-05-18: qty 50

## 2019-05-18 MED ORDER — POTASSIUM CHLORIDE CRYS ER 20 MEQ PO TBCR
40.0000 meq | EXTENDED_RELEASE_TABLET | Freq: Once | ORAL | Status: AC
  Administered 2019-05-18: 40 meq via ORAL
  Filled 2019-05-18: qty 2

## 2019-05-18 MED ORDER — SENNA 8.6 MG PO TABS
1.0000 | ORAL_TABLET | Freq: Every day | ORAL | Status: DC | PRN
  Administered 2019-05-19: 8.6 mg via ORAL
  Filled 2019-05-18: qty 1

## 2019-05-18 MED ORDER — FUROSEMIDE 40 MG PO TABS
20.0000 mg | ORAL_TABLET | Freq: Every day | ORAL | Status: DC
  Administered 2019-05-18 – 2019-05-22 (×5): 20 mg via ORAL
  Filled 2019-05-18 (×5): qty 1

## 2019-05-18 MED ORDER — HYDRALAZINE HCL 20 MG/ML IJ SOLN
10.0000 mg | Freq: Four times a day (QID) | INTRAMUSCULAR | Status: DC | PRN
  Administered 2019-05-18 – 2019-05-22 (×2): 10 mg via INTRAVENOUS
  Filled 2019-05-18 (×2): qty 1

## 2019-05-18 MED ORDER — QUETIAPINE FUMARATE 25 MG PO TABS
25.0000 mg | ORAL_TABLET | Freq: Every day | ORAL | Status: DC
  Administered 2019-05-18 – 2019-05-21 (×5): 25 mg via ORAL
  Filled 2019-05-18 (×5): qty 1

## 2019-05-18 MED ORDER — METOPROLOL TARTRATE 25 MG PO TABS
25.0000 mg | ORAL_TABLET | Freq: Two times a day (BID) | ORAL | Status: DC
  Administered 2019-05-18 (×2): 25 mg via ORAL
  Filled 2019-05-18 (×2): qty 1

## 2019-05-18 MED ORDER — CARVEDILOL 6.25 MG PO TABS
6.2500 mg | ORAL_TABLET | Freq: Two times a day (BID) | ORAL | Status: DC
  Administered 2019-05-18 – 2019-05-22 (×8): 6.25 mg via ORAL
  Filled 2019-05-18 (×8): qty 1

## 2019-05-18 MED ORDER — ALLOPURINOL 100 MG PO TABS
100.0000 mg | ORAL_TABLET | Freq: Every day | ORAL | Status: DC
  Administered 2019-05-18 – 2019-05-22 (×5): 100 mg via ORAL
  Filled 2019-05-18 (×5): qty 1

## 2019-05-18 MED ORDER — ONDANSETRON HCL 4 MG PO TABS: 4.0000 mg | ORAL_TABLET | Freq: Four times a day (QID) | ORAL | Status: DC | PRN

## 2019-05-18 MED ORDER — SODIUM CHLORIDE 0.9% FLUSH
3.0000 mL | Freq: Two times a day (BID) | INTRAVENOUS | Status: DC
  Administered 2019-05-18 – 2019-05-22 (×9): 3 mL via INTRAVENOUS

## 2019-05-18 MED ORDER — ATORVASTATIN CALCIUM 20 MG PO TABS
40.0000 mg | ORAL_TABLET | Freq: Every day | ORAL | Status: DC
  Administered 2019-05-18 – 2019-05-21 (×4): 40 mg via ORAL
  Filled 2019-05-18 (×4): qty 2

## 2019-05-18 MED ORDER — ONDANSETRON HCL 4 MG/2ML IJ SOLN: 4.0000 mg | Freq: Four times a day (QID) | INTRAMUSCULAR | Status: DC | PRN

## 2019-05-18 MED ORDER — POLYETHYLENE GLYCOL 3350 17 G PO PACK: 17.0000 g | PACK | Freq: Every day | ORAL | Status: DC | PRN

## 2019-05-18 MED ORDER — OXYCODONE-ACETAMINOPHEN 5-325 MG PO TABS
1.0000 | ORAL_TABLET | Freq: Four times a day (QID) | ORAL | Status: DC | PRN
  Administered 2019-05-18 – 2019-05-22 (×7): 1 via ORAL
  Filled 2019-05-18 (×7): qty 1

## 2019-05-18 MED ORDER — POTASSIUM CHLORIDE 10 MEQ/100ML IV SOLN
10.0000 meq | INTRAVENOUS | Status: AC
  Administered 2019-05-18 (×2): 10 meq via INTRAVENOUS
  Filled 2019-05-18 (×2): qty 100

## 2019-05-18 MED ORDER — POTASSIUM CHLORIDE CRYS ER 20 MEQ PO TBCR
20.0000 meq | EXTENDED_RELEASE_TABLET | Freq: Two times a day (BID) | ORAL | Status: DC
  Administered 2019-05-18 – 2019-05-22 (×8): 20 meq via ORAL
  Filled 2019-05-18 (×8): qty 1

## 2019-05-18 MED ORDER — ACETAMINOPHEN 650 MG RE SUPP: 650.0000 mg | Freq: Four times a day (QID) | RECTAL | Status: DC | PRN

## 2019-05-18 MED ORDER — SODIUM CHLORIDE 0.9 % IV SOLN
250.0000 mL | INTRAVENOUS | Status: DC | PRN
  Administered 2019-05-18: 250 mL via INTRAVENOUS

## 2019-05-18 MED ORDER — ESCITALOPRAM OXALATE 10 MG PO TABS
20.0000 mg | ORAL_TABLET | Freq: Every day | ORAL | Status: DC
  Administered 2019-05-18 – 2019-05-22 (×5): 20 mg via ORAL
  Filled 2019-05-18 (×5): qty 2

## 2019-05-18 MED ORDER — TRAZODONE HCL 100 MG PO TABS
100.0000 mg | ORAL_TABLET | Freq: Once | ORAL | Status: AC
  Administered 2019-05-18: 100 mg via ORAL
  Filled 2019-05-18: qty 1

## 2019-05-18 MED ORDER — ACETAMINOPHEN 325 MG PO TABS
650.0000 mg | ORAL_TABLET | Freq: Four times a day (QID) | ORAL | Status: DC | PRN
  Administered 2019-05-18: 650 mg via ORAL
  Filled 2019-05-18: qty 2

## 2019-05-18 MED ORDER — ENOXAPARIN SODIUM 40 MG/0.4ML ~~LOC~~ SOLN
40.0000 mg | SUBCUTANEOUS | Status: DC
  Administered 2019-05-18 – 2019-05-22 (×5): 40 mg via SUBCUTANEOUS
  Filled 2019-05-18 (×5): qty 0.4

## 2019-05-18 NOTE — Progress Notes (Signed)
   05/18/19 0700  Clinical Encounter Type  Visited With Patient not available  Visit Type Initial  Referral From Nurse  Spiritual Encounters  Spiritual Needs Prayer  Chaplain received OR for prayer and arrived at patient room and she was asleep. Chaplain will need to follow up.

## 2019-05-18 NOTE — Progress Notes (Signed)
Ch attempted to complete prayer request for pt. Pt was resting upon entry. Pt's husband had just left from visiting with pt.  F/U recommended at a later time.    05/18/19 1400  Clinical Encounter Type  Visited With Patient  Visit Type Spiritual support  Referral From Physician  Consult/Referral To Chaplain  Spiritual Encounters  Spiritual Needs Prayer

## 2019-05-18 NOTE — Progress Notes (Signed)
RN bladder scan pt and she had over 950 ML of urine in her bladder. Per MD okay for RN to place order to in and out pt.

## 2019-05-18 NOTE — Progress Notes (Signed)
SLP Cancellation Note  Patient Details Name: Deborah Silva MRN: [id number] DOB: 12-29-1950   Cancelled treatment:       Reason Eval/Treat Not Completed: Patient not medically ready; Chart reviewed. Nsg consulted. Upon entering pt's room, found pt speaking to herself with her eyes closed in a profound state of confusion, anxiously pleading with someone who was not present at times. Pt opened eyes to command x1 however unable to perform again. Found pt soiled in bed, with loose bowel movement. Notified nsg and assisted NT in cleaning pt/bed. Pt currently unable to adequately participate in BSE at this time, will re-attempt tomorrow if patient able to participate. NT reported pt refused to eat soft solids at breakfast today, preferring to drink liquids. NT reported no overt s/s aspiration observed this morning with thin liquids. Will downgrade diet to Dysphagia I (puree) due to cognitive status for swallowing safety. Nsg to hold trays if pt not alert enough for PO's. Rec give meds whole in puree, crushed if needed. SLP to f/u and re-attempt tomorrow. Teyana Pierron, MA, CCC-SLP 05/18/2019, 2:19 PM

## 2019-05-18 NOTE — Progress Notes (Signed)
Per MD okay for RN to order potassium recheck.

## 2019-05-18 NOTE — H&P (Signed)
[employer] [facility] [place] [facility]   PATIENT NAME: Deborah Silva    MR#:  [id number]  DATE OF BIRTH:  09/08/1951  DATE OF ADMISSION:  05/17/2019  PRIMARY CARE PHYSICIAN: Lynnea FerrierKlein, Bert J III, MD   REQUESTING/REFERRING PHYSICIAN: Dorothea GlassmanPaul Malinda, MD  CHIEF COMPLAINT:   Chief Complaint  Patient presents with   Weakness    HISTORY OF PRESENT ILLNESS:  Deborah FireMary Barcia  is a 68 y.o. female with a known history of Parkinson's disease, dementia, and asthma.  She lives at home with her husband.  He called EMS services for transfer to the emergency room reporting a rapid decrease in her functioning over the last few weeks with patient becoming unable to ambulate over the last 24 hours.  According to EMS services, patient was not able to ambulate requiring 3 people assistance to walk to the stretcher on their arrival to her home.  EMS reported patient complaining of inability to sleep.  However, at the time I am seeing the patient, she is nonverbal and does not follow commands.  She will open her eyes to pain.  According to patient's husband, she has had no recent illness.  No fever, chills, nausea, vomiting, diarrhea.  No complaints of abdominal pain or chest pain.  No increased shortness of breath that he is aware of.  No recent injuries or falls.  MRI brain demonstrates no acute intracranial abnormalities.  Rapid COVID-19 testing is negative.  Chest x-ray shows no acute pulmonary disease.  Urinalysis is negative.  Potassium is 3.3.  High-sensitivity troponin is 9.  We have admitted her to the hospitalist service for further management.   PAST MEDICAL HISTORY:   Past Medical History:  Diagnosis Date   Asthma    Dementia (HCC)    Parkinson disease (HCC)     PAST SURGICAL HISTORY:  History reviewed. No pertinent surgical history.  SOCIAL HISTORY:   Social History   Tobacco Use   Smoking status: Never Smoker   Smokeless tobacco: Never Used  Substance Use Topics    Alcohol use: No    FAMILY HISTORY:   Family History  Problem Relation Age of Onset   Breast cancer Neg Hx     DRUG ALLERGIES:   Allergies  Allergen Reactions   Naproxen Sodium Shortness Of Breath   Aminophylline Other (See Comments)    Heart racing   Penicillins    Theophyllines Other (See Comments)    Heart racing    REVIEW OF SYSTEMS:   ROS Unable to be performed as patient is nonverbal  MEDICATIONS AT HOME:   Prior to Admission medications   Medication Sig Start Date End Date Taking? Authorizing Provider  allopurinol (ZYLOPRIM) 100 MG tablet Take 100 mg by mouth daily. 03/13/19  Yes [provider]  atorvastatin (LIPITOR) 40 MG tablet Take 40 mg by mouth daily. 03/29/19  Yes [provider]  doxepin (SINEQUAN) 10 MG capsule Take 10 mg by mouth at bedtime as needed. 05/10/19  Yes [provider]  escitalopram (LEXAPRO) 20 MG tablet Take 20 mg by mouth daily. 03/24/19  Yes [provider]  Fluticasone-Salmeterol (WIXELA INHUB) 250-50 MCG/DOSE AEPB Inhale 1 puff into the lungs every 12 (twelve) hours. 07/18/18  Yes [provider]  furosemide (LASIX) 20 MG tablet Take 20 mg by mouth daily. 03/05/19  Yes [provider]  metoprolol tartrate (LOPRESSOR) 25 MG tablet Take 25 mg by mouth 2 (two) times daily. 04/28/19  Yes [provider]  potassium chloride (K-DUR) 10 MEQ tablet Take 10 mEq by mouth 2 (two) times a day. 08/08/18 08/08/19 Yes [provider]  QUEtiapine (SEROQUEL) 25 MG tablet Take 25 mg by mouth at bedtime. 04/17/19  Yes [provider]  traZODone (DESYREL) 100 MG tablet Take 100 mg by mouth Nightly. 05/11/19 06/10/19 Yes [provider]      VITAL SIGNS:  Blood pressure (!) 162/96, pulse (!) 58, temperature 98.7 F (37.1 C), temperature source Oral, resp. rate 20, height 5\' 3"  (1.6 m), weight 81.2 kg, SpO2 94 %.  PHYSICAL EXAMINATION:  Physical Exam  GENERAL:  68  y.o.-year-old patient lying in the bed with no acute distress.  EYES: Pupils equal, round, reactive to light and accommodation. No scleral icterus. Extraocular muscles intact.  HEENT: Head atraumatic, normocephalic. Oropharynx and nasopharynx clear.  NECK:  Supple, no jugular venous distention. No thyroid enlargement, no tenderness.  LUNGS: Normal breath sounds bilaterally, no wheezing, rales,rhonchi or crepitation. No use of accessory muscles of respiration.  CARDIOVASCULAR: Regular rate and rhythm, S1, S2 normal. No murmurs, rubs, or gallops.  ABDOMEN: Soft, nondistended. Bowel sounds present. No organomegaly or mass.  EXTREMITIES: No pedal edema, cyanosis, or clubbing.  NEUROLOGIC: Patient is nonverbal and unable to follow commands.   PSYCHIATRIC: Patient nonverbal  sKIN: No obvious rash, lesion, or ulcer.   LABORATORY PANEL:   CBC Recent Labs  Lab 05/17/19 1640  WBC 7.1  HGB 11.6*  HCT 35.1*  PLT 195   ------------------------------------------------------------------------------------------------------------------  Chemistries  Recent Labs  Lab 05/17/19 1640  NA 141  K 3.3*  CL 103  CO2 29  GLUCOSE 109*  BUN 23  CREATININE 1.14*  CALCIUM 9.1  AST 21  ALT 17  ALKPHOS 63  BILITOT 0.6   ------------------------------------------------------------------------------------------------------------------  Cardiac Enzymes No results for input(s): TROPONINI in the last 168 hours. ------------------------------------------------------------------------------------------------------------------  RADIOLOGY:  Ct Head Wo Contrast  Result Date: 05/17/2019 CLINICAL DATA:  Right-sided weakness and lethargy. Reported history of Parkinson's disease and dementia EXAM: CT HEAD WITHOUT CONTRAST TECHNIQUE: Contiguous axial images were obtained from the base of the skull through the vertex without intravenous contrast. COMPARISON:  Mar 29, 2019 FINDINGS: Brain: Ventricles and sulci are  within normal limits for age. There is no intracranial mass, hemorrhage, extra-axial fluid collection, or midline shift. The brain parenchyma appears unremarkable. No acute infarct is demonstrable. Vascular: There is no hyperdense vessel. There is calcification in each carotid siphon region. Skull: The bony calvarium appears intact. Sinuses/Orbits: Visualized paranasal sinuses are clear. Orbits appear symmetric bilaterally. Other: Mastoid air cells are clear. IMPRESSION: Brain parenchyma appears unremarkable. No acute infarct. No mass or hemorrhage. There are foci of arterial vascular calcification. Electronically Signed   By: Lowella Grip III M.D.   On: 05/17/2019 17:04   Mr Brain Wo Contrast  Result Date: 05/17/2019 CLINICAL DATA:  Weakness.  Dementia.  Sleep disturbance. EXAM: MRI HEAD WITHOUT CONTRAST TECHNIQUE: Multiplanar, multiecho pulse sequences of the brain and surrounding structures were obtained without intravenous contrast. COMPARISON:  Head CT same day FINDINGS: Brain: Diffusion imaging does not show any acute or subacute infarction. No abnormality is seen affecting the brainstem or cerebellum. Cerebral hemispheres show mild chronic small-vessel ischemic changes of the white matter. No cortical or large vessel territory infarction. No mass lesion, hemorrhage, hydrocephalus or extra-axial collection. Vascular: Major vessels at the base of the brain show flow. Skull and upper cervical spine: Negative Sinuses/Orbits: Clear/normal Other: None IMPRESSION: No acute or reversible finding. Mild chronic small-vessel  ischemic change of the cerebral hemispheric white matter. Electronically Signed   By: Paulina FusiMark  Shogry M.D.   On: 05/17/2019 20:37   Dg Chest Portable 1 View  Result Date: 05/17/2019 CLINICAL DATA:  Lethargy.  Dementia. EXAM: PORTABLE CHEST 1 VIEW COMPARISON:  October 14, 2018 FINDINGS: There is no edema or consolidation. Heart is borderline enlarged with pulmonary vascularity normal. No  adenopathy. No bone lesions. IMPRESSION: Borderline cardiac enlargement.  No edema or consolidation. Electronically Signed   By: Bretta BangWilliam  Woodruff III M.D.   On: 05/17/2019 17:23      IMPRESSION AND PLAN:   1.  Dementia with behavioral disturbance, inability to communicate - We will consult social service for home health needs and assistance - We will continue every 4 hour neurologic checks - Physical therapy will be consulted for supportive care with increased weakness  2.  Inability to ambulate - We will consult physical therapy and occupational therapy for supportive care  3.  Advancing dementia - We will consult speech therapy for swallowing evaluation -Aspiration precautions  4.  Parkinson's disease - Sinemet continued - PT and OT consulted for supportive care  5.  Hypokalemia - Potassium 3.3 -IV potassium replacement -Repeat BMP in the a.m. -Telemetry monitoring  6.  History of asthma - No evidence of exacerbation - Patient will have as needed albuterol for shortness of breath or wheezing  DVT and PPI prophylaxis initiated    All the records are reviewed and case discussed with ED provider. The plan of care was discussed in details with the patient (and family). I answered all questions. The patient agreed to proceed with the above mentioned plan. Further management will depend upon hospital course.   CODE STATUS: Full code  TOTAL TIME TAKING CARE OF THIS PATIENT: 45 minutes.    Milas Kocherngela H Williemae Muriel CRNPon 05/18/2019 at 3:27 AM  Pager - [phone]  After 6pm go to [email or web address] - password [email or web address] [email or web address]  [employer] [place] [facility]  Office  [phone]  CC: Primary care physician; Lynnea FerrierKlein, Bert J III, MD   Note: This dictation was prepared with Dragon dictation along with smaller phrase technology. Any transcriptional errors that result from this process are unintentional.

## 2019-05-18 NOTE — Progress Notes (Signed)
[facility] [place] NAME: Deborah Silva    MR#:  [id number]  DATE OF BIRTH:  1951-08-25  SUBJECTIVE:  CHIEF COMPLAINT:   Chief Complaint  Patient presents with  . Weakness   The patient is demented and noncommunicative.  She complains of back pain per RN. REVIEW OF SYSTEMS:  Review of Systems  Unable to perform ROS: Dementia    DRUG ALLERGIES:   Allergies  Allergen Reactions  . Naproxen Sodium Shortness Of Breath  . Aminophylline Other (See Comments)    Heart racing  . Penicillins   . Theophyllines Other (See Comments)    Heart racing   VITALS:  Blood pressure (!) 164/70, pulse 74, temperature 98.8 F (37.1 C), temperature source Oral, resp. rate 20, height 5\' 3"  (1.6 m), weight 83.3 kg, SpO2 92 %. PHYSICAL EXAMINATION:  Physical Exam Constitutional:      General: She is not in acute distress. HENT:     Head: Normocephalic.     Mouth/Throat:     Mouth: Mucous membranes are moist.  Eyes:     General: No scleral icterus.    Conjunctiva/sclera: Conjunctivae normal.     Pupils: Pupils are equal, round, and reactive to light.  Neck:     Musculoskeletal: Normal range of motion and neck supple.     Vascular: No JVD.     Trachea: No tracheal deviation.  Cardiovascular:     Rate and Rhythm: Normal rate and regular rhythm.     Heart sounds: Normal heart sounds. No murmur. No gallop.   Pulmonary:     Effort: Pulmonary effort is normal. No respiratory distress.     Breath sounds: Normal breath sounds. No wheezing or rales.  Abdominal:     General: Bowel sounds are normal. There is no distension.     Palpations: Abdomen is soft.     Tenderness: There is no abdominal tenderness. There is no rebound.  Musculoskeletal:        General: No tenderness.  Skin:    Findings: No erythema or rash.  Neurological:     Mental Status: She is alert.     Comments: Unable to exam.  Psychiatric:     Comments: Demented.    LABORATORY  PANEL:  Female CBC Recent Labs  Lab 05/18/19 0426  WBC 6.3  HGB 10.0*  HCT 30.4*  PLT 180   ------------------------------------------------------------------------------------------------------------------ Chemistries  Recent Labs  Lab 05/17/19 1640 05/18/19 0426  NA 141 143  K 3.3* 2.5*  CL 103 104  CO2 29 30  GLUCOSE 109* 109*  BUN 23 21  CREATININE 1.14* 0.99  CALCIUM 9.1 8.6*  MG  --  2.0  AST 21  --   ALT 17  --   ALKPHOS 63  --   BILITOT 0.6  --    RADIOLOGY:  Ct Head Wo Contrast  Result Date: 05/17/2019 CLINICAL DATA:  Right-sided weakness and lethargy. Reported history of Parkinson's disease and dementia EXAM: CT HEAD WITHOUT CONTRAST TECHNIQUE: Contiguous axial images were obtained from the base of the skull through the vertex without intravenous contrast. COMPARISON:  Mar 29, 2019 FINDINGS: Brain: Ventricles and sulci are within normal limits for age. There is no intracranial mass, hemorrhage, extra-axial fluid collection, or midline shift. The brain parenchyma appears unremarkable. No acute infarct is demonstrable. Vascular: There is no hyperdense vessel. There is calcification in each carotid siphon region. Skull: The bony calvarium appears intact. Sinuses/Orbits: Visualized paranasal sinuses are  clear. Orbits appear symmetric bilaterally. Other: Mastoid air cells are clear. IMPRESSION: Brain parenchyma appears unremarkable. No acute infarct. No mass or hemorrhage. There are foci of arterial vascular calcification. Electronically Signed   By: Bretta BangWilliam  Woodruff III M.D.   On: 05/17/2019 17:04   Mr Brain Wo Contrast  Result Date: 05/17/2019 CLINICAL DATA:  Weakness.  Dementia.  Sleep disturbance. EXAM: MRI HEAD WITHOUT CONTRAST TECHNIQUE: Multiplanar, multiecho pulse sequences of the brain and surrounding structures were obtained without intravenous contrast. COMPARISON:  Head CT same day FINDINGS: Brain: Diffusion imaging does not show any acute or subacute infarction.  No abnormality is seen affecting the brainstem or cerebellum. Cerebral hemispheres show mild chronic small-vessel ischemic changes of the white matter. No cortical or large vessel territory infarction. No mass lesion, hemorrhage, hydrocephalus or extra-axial collection. Vascular: Major vessels at the base of the brain show flow. Skull and upper cervical spine: Negative Sinuses/Orbits: Clear/normal Other: None IMPRESSION: No acute or reversible finding. Mild chronic small-vessel ischemic change of the cerebral hemispheric white matter. Electronically Signed   By: Paulina FusiMark  Shogry M.D.   On: 05/17/2019 20:37   Dg Chest Portable 1 View  Result Date: 05/17/2019 CLINICAL DATA:  Lethargy.  Dementia. EXAM: PORTABLE CHEST 1 VIEW COMPARISON:  October 14, 2018 FINDINGS: There is no edema or consolidation. Heart is borderline enlarged with pulmonary vascularity normal. No adenopathy. No bone lesions. IMPRESSION: Borderline cardiac enlargement.  No edema or consolidation. Electronically Signed   By: Bretta BangWilliam  Woodruff III M.D.   On: 05/17/2019 17:23   ASSESSMENT AND PLAN:   1.  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  2.  Inability to ambulate PT and OT evaluation.  3.  Advancing dementia Dysphagia diet. -Aspiration precautions  4.  Parkinson's disease - Sinemet continued - PT and OT consulted for supportive care  5.  Hypokalemia. Potassium supplement and follow-up level.  Magnesium is normal.  6.  History of asthma Stable.  Nebulizer PRN. I called her husband, but nobody answered the phone. All the records are reviewed and case discussed with Care Management/Social Worker. Management plans discussed with the patient, family and they are in agreement.  CODE STATUS: Full Code  TOTAL TIME TAKING CARE OF THIS PATIENT: 32 minutes.   More than 50% of the time was spent in counseling/coordination of care: YES  POSSIBLE D/C IN 2 DAYS, DEPENDING  ON CLINICAL CONDITION.   Shaune PollackQing Nettie Wyffels M.D on 05/18/2019 at 12:57 PM  Between 7am to 6pm - Pager - [phone]  After 6pm go to [email or web address] - Therapist, nutritionalpassword EPAS [email or web address]  [employer] [place] [facility]

## 2019-05-18 NOTE — Progress Notes (Signed)
OT Cancellation Note  Patient Details Name: Deborah Silva MRN: [id number] DOB: [birth year]   Cancelled Treatment:    Reason Eval/Treat Not Completed: Medical issues which prohibited therapy Order received for OT evaluation and chart reviewed.  Pt with very low potassium which decreased to 2.5 this morning.  Per OT guidelines, participation in therapy is contraindicated.  Will hold OT at this time and re-attempt OT evaluation at a later date/time as medically appropriate.  Thank you for the referral.   Chrys Racer, OTR/L, [facility] ascom [phone] 05/18/19, 12:46 PM

## 2019-05-18 NOTE — ED Notes (Signed)
ED TO INPATIENT HANDOFF REPORT  ED Nurse Name and Phone #: Erie NoeVanessa [phone]  S Name/Age/Gender Deborah PoissonMary A Ingwersen 68 y.o. female Room/Bed: ED24A/ED24A  Code Status   Code Status: Full Code  Home/SNF/Other Home   Triage Complete: Triage complete  Chief Complaint Weakness  Triage Note Pt arrived via ems for report of weakness and decreased sleep - pt has hx of parkinson and dementia with sundowners - pt can normally ambulate unassisted but starting yesterday she cannot   Allergies Allergies  Allergen Reactions  . Naproxen Sodium Shortness Of Breath  . Aminophylline Other (See Comments)    Heart racing  . Penicillins   . Theophyllines Other (See Comments)    Heart racing    Level of Care/Admitting Diagnosis ED Disposition    ED Disposition Condition Comment   Admit  [facility] [[id number]]  Level of Care: Med-Surg [16]  Covid Evaluation: Asymptomatic Screening Protocol (No Symptoms)  Diagnosis: Unable to ambulate [[id number]]  Admitting Physician: Pearletha AlfredSEALS, ANGELA H [[id number]]  Attending Physician: Pearletha AlfredSEALS, ANGELA H [[id number]]  Estimated length of stay: past midnight tomorrow  Certification:: I certify this patient will need inpatient services for at least 2 midnights  PT Class (Do Not Modify): Inpatient [101]  PT Acc Code (Do Not Modify): Private [1]       B Medical/Surgery History Past Medical History:  Diagnosis Date  . Asthma   . Dementia (HCC)   . Parkinson disease (HCC)    History reviewed. No pertinent surgical history.   A IV Location/Drains/Wounds Patient Lines/Drains/Airways Status   Active Line/Drains/Airways    Name:   Placement date:   Placement time:   Site:   Days:   Peripheral IV 05/17/19 Right Hand   05/17/19    1642    Hand   1          Intake/Output Last 24 hours No intake or output data in the 24 hours ending 05/18/19 0050  Labs/Imaging Results for orders placed or performed during the hospital encounter of  05/17/19 (from the past 48 hour(s))  Comprehensive metabolic panel     Status: Abnormal   Collection Time: 05/17/19  4:40 PM  Result Value Ref Range   Sodium 141 135 - 145 mmol/L   Potassium 3.3 (L) 3.5 - 5.1 mmol/L   Chloride 103 98 - 111 mmol/L   CO2 29 22 - 32 mmol/L   Glucose, Bld 109 (H) 70 - 99 mg/dL   BUN 23 8 - 23 mg/dL   Creatinine, Ser 6.011.14 (H) 0.44 - 1.00 mg/dL   Calcium 9.1 8.9 - 09.310.3 mg/dL   Total Protein 6.8 6.5 - 8.1 g/dL   Albumin 3.9 3.5 - 5.0 g/dL   AST 21 15 - 41 U/L   ALT 17 0 - 44 U/L   Alkaline Phosphatase 63 38 - 126 U/L   Total Bilirubin 0.6 0.3 - 1.2 mg/dL   GFR calc non Af Amer 49 (L) >60 mL/min   GFR calc Af Amer 57 (L) >60 mL/min   Anion gap 9 5 - 15    Comment: Performed at [facility], [address] [place], [place] [address]  Troponin I (High Sensitivity)     Status: None   Collection Time: 05/17/19  4:40 PM  Result Value Ref Range   Troponin I (High Sensitivity) 16 <18 ng/L    Comment: (NOTE) Elevated high sensitivity troponin I (hsTnI) values and significant  changes across serial measurements may suggest ACS  but many other  chronic and acute conditions are known to elevate hsTnI results.  Refer to the "Links" section for chest pain algorithms and additional  guidance. Performed at [facility], [address] [place], [place] [address]   CBC with Differential     Status: Abnormal   Collection Time: 05/17/19  4:40 PM  Result Value Ref Range   WBC 7.1 4.0 - 10.5 K/uL   RBC 3.87 3.87 - 5.11 MIL/uL   Hemoglobin 11.6 (L) 12.0 - 15.0 g/dL   HCT 60.4 (L) 54.0 - 98.1 %   MCV 90.7 80.0 - 100.0 fL   MCH 30.0 26.0 - 34.0 pg   MCHC 33.0 30.0 - 36.0 g/dL   RDW 19.1 47.8 - 29.5 %   Platelets 195 150 - 400 K/uL   nRBC 0.0 0.0 - 0.2 %   Neutrophils Relative % 55 %   Neutro Abs 3.9 1.7 - 7.7 K/uL   Lymphocytes Relative 33 %   Lymphs Abs 2.3 0.7 - 4.0 K/uL   Monocytes Relative 8 %   Monocytes Absolute 0.6 0.1 - 1.0 K/uL    Eosinophils Relative 3 %   Eosinophils Absolute 0.2 0.0 - 0.5 K/uL   Basophils Relative 1 %   Basophils Absolute 0.1 0.0 - 0.1 K/uL   Immature Granulocytes 0 %   Abs Immature Granulocytes 0.02 0.00 - 0.07 K/uL    Comment: Performed at [facility], [address] [place], [place] [address]  TSH     Status: None   Collection Time: 05/17/19  4:40 PM  Result Value Ref Range   TSH 3.219 0.350 - 4.500 uIU/mL    Comment: Performed by a 3rd Generation assay with a functional sensitivity of <=0.01 uIU/mL. Performed at [facility], [address] [place], [place] [address]   Urinalysis, Complete w Microscopic     Status: Abnormal   Collection Time: 05/17/19  9:32 PM  Result Value Ref Range   Color, Urine YELLOW (A) YELLOW   APPearance CLEAR (A) CLEAR   Specific Gravity, Urine 1.025 1.005 - 1.030   pH 5.0 5.0 - 8.0   Glucose, UA NEGATIVE NEGATIVE mg/dL   Hgb urine dipstick NEGATIVE NEGATIVE   Bilirubin Urine NEGATIVE NEGATIVE   Ketones, ur NEGATIVE NEGATIVE mg/dL   Protein, ur NEGATIVE NEGATIVE mg/dL   Nitrite NEGATIVE NEGATIVE   Leukocytes,Ua NEGATIVE NEGATIVE   RBC / HPF 0-5 0 - 5 RBC/hpf   WBC, UA 0-5 0 - 5 WBC/hpf   Bacteria, UA NONE SEEN NONE SEEN   Squamous Epithelial / LPF 0-5 0 - 5   Mucus PRESENT    Hyaline Casts, UA PRESENT     Comment: Performed at [facility], [address], [place], [place] [address]  Troponin I (High Sensitivity)     Status: None   Collection Time: 05/17/19  9:32 PM  Result Value Ref Range   Troponin I (High Sensitivity) 9 <18 ng/L    Comment: (NOTE) Elevated high sensitivity troponin I (hsTnI) values and significant  changes across serial measurements may suggest ACS but many other  chronic and acute conditions are known to elevate hsTnI results.  Refer to the "Links" section for chest pain algorithms and additional  guidance. Performed at [facility], [address], [place], [place] [address]   SARS  Coronavirus 2 (CEPHEID - Performed in [facility] hospital lab), Hosp Order     Status: None   Collection Time: 05/17/19 11:03 PM  Specimen: Nasopharyngeal Swab  Result Value Ref Range   SARS Coronavirus 2 NEGATIVE NEGATIVE    Comment: (NOTE) If result is NEGATIVE SARS-CoV-2 target nucleic acids are NOT DETECTED. The SARS-CoV-2 RNA is generally detectable in upper and lower  respiratory specimens during the acute phase of infection. The lowest  concentration of SARS-CoV-2 viral copies this assay can detect is 250  copies / mL. A negative result does not preclude SARS-CoV-2 infection  and should not be used as the sole basis for treatment or other  patient management decisions.  A negative result may occur with  improper specimen collection / handling, submission of specimen other  than nasopharyngeal swab, presence of viral mutation(s) within the  areas targeted by this assay, and inadequate number of viral copies  (<250 copies / mL). A negative result must be combined with clinical  observations, patient history, and epidemiological information. If result is POSITIVE SARS-CoV-2 target nucleic acids are DETECTED. The SARS-CoV-2 RNA is generally detectable in upper and lower  respiratory specimens dur ing the acute phase of infection.  Positive  results are indicative of active infection with SARS-CoV-2.  Clinical  correlation with patient history and other diagnostic information is  necessary to determine patient infection status.  Positive results do  not rule out bacterial infection or co-infection with other viruses. If result is PRESUMPTIVE POSTIVE SARS-CoV-2 nucleic acids MAY BE PRESENT.   A presumptive positive result was obtained on the submitted specimen  and confirmed on repeat testing.  While 2019 novel coronavirus  (SARS-CoV-2) nucleic acids may be present in the submitted sample  additional confirmatory testing may be necessary for epidemiological  and / or clinical  management purposes  to differentiate between  SARS-CoV-2 and other Sarbecovirus currently known to infect humans.  If clinically indicated additional testing with an alternate test  methodology [phone]([id number]) is advised. The SARS-CoV-2 RNA is generally  detectable in upper and lower respiratory sp ecimens during the acute  phase of infection. The expected result is Negative. Fact Sheet for Patients:  BoilerBrush.com.cyhttps://www.fda.gov/media/136312/download Fact Sheet for Healthcare Providers: https://pope.com/https://www.fda.gov/media/136313/download This test is not yet approved or cleared by the [place] FDA and has been authorized for detection and/or diagnosis of SARS-CoV-2 by FDA under an Emergency Use Authorization (EUA).  This EUA will remain in effect (meaning this test can be used) for the duration of the COVID-19 declaration under Section 564(b)(1) of the Act, 21 U.S.C. section 360bbb-3(b)(1), unless the authorization is terminated or revoked sooner. Performed at [facility], [address] [place], [place] [address]    Ct Head Wo Contrast  Result Date: 05/17/2019 CLINICAL DATA:  Right-sided weakness and lethargy. Reported history of Parkinson's disease and dementia EXAM: CT HEAD WITHOUT CONTRAST TECHNIQUE: Contiguous axial images were obtained from the base of the skull through the vertex without intravenous contrast. COMPARISON:  Mar 29, 2019 FINDINGS: Brain: Ventricles and sulci are within normal limits for age. There is no intracranial mass, hemorrhage, extra-axial fluid collection, or midline shift. The brain parenchyma appears unremarkable. No acute infarct is demonstrable. Vascular: There is no hyperdense vessel. There is calcification in each carotid siphon region. Skull: The bony calvarium appears intact. Sinuses/Orbits: Visualized paranasal sinuses are clear. Orbits appear symmetric bilaterally. Other: Mastoid air cells are clear. IMPRESSION: Brain parenchyma appears unremarkable. No acute  infarct. No mass or hemorrhage. There are foci of arterial vascular calcification. Electronically Signed   By: Bretta BangWilliam  Woodruff III M.D.   On: 05/17/2019 17:04   Mr Brain Ilda BassetWo  Contrast  Result Date: 05/17/2019 CLINICAL DATA:  Weakness.  Dementia.  Sleep disturbance. EXAM: MRI HEAD WITHOUT CONTRAST TECHNIQUE: Multiplanar, multiecho pulse sequences of the brain and surrounding structures were obtained without intravenous contrast. COMPARISON:  Head CT same day FINDINGS: Brain: Diffusion imaging does not show any acute or subacute infarction. No abnormality is seen affecting the brainstem or cerebellum. Cerebral hemispheres show mild chronic small-vessel ischemic changes of the white matter. No cortical or large vessel territory infarction. No mass lesion, hemorrhage, hydrocephalus or extra-axial collection. Vascular: Major vessels at the base of the brain show flow. Skull and upper cervical spine: Negative Sinuses/Orbits: Clear/normal Other: None IMPRESSION: No acute or reversible finding. Mild chronic small-vessel ischemic change of the cerebral hemispheric white matter. Electronically Signed   By: Paulina FusiMark  Shogry M.D.   On: 05/17/2019 20:37   Dg Chest Portable 1 View  Result Date: 05/17/2019 CLINICAL DATA:  Lethargy.  Dementia. EXAM: PORTABLE CHEST 1 VIEW COMPARISON:  October 14, 2018 FINDINGS: There is no edema or consolidation. Heart is borderline enlarged with pulmonary vascularity normal. No adenopathy. No bone lesions. IMPRESSION: Borderline cardiac enlargement.  No edema or consolidation. Electronically Signed   By: Bretta BangWilliam  Woodruff III M.D.   On: 05/17/2019 17:23    Pending Labs Unresulted Labs (From admission, onward)    Start     Ordered   05/24/19 0500  Creatinine, serum  (enoxaparin (LOVENOX)    CrCl >/= 30 ml/min)  Weekly,   STAT    Comments: while on enoxaparin therapy    05/18/19 0029   05/18/19 0500  Basic metabolic panel  Tomorrow morning,   STAT     05/18/19 0029   05/18/19 0500   CBC  Tomorrow morning,   STAT     05/18/19 0029   05/18/19 0030  HIV antibody (Routine Testing)  Once,   STAT     05/18/19 0029   05/18/19 0030  CBC  (enoxaparin (LOVENOX)    CrCl >/= 30 ml/min)  Once,   STAT    Comments: Baseline for enoxaparin therapy IF NOT ALREADY DRAWN.  Notify MD if PLT < 100 K.    05/18/19 0029   05/18/19 0030  Creatinine, serum  (enoxaparin (LOVENOX)    CrCl >/= 30 ml/min)  Once,   STAT    Comments: Baseline for enoxaparin therapy IF NOT ALREADY DRAWN.    05/18/19 0029   05/18/19 0030  TSH  Once,   STAT     05/18/19 0029   05/18/19 0030  Urinalysis, Complete w Microscopic  Once,   STAT     05/18/19 0029          Vitals/Pain Today's Vitals   05/17/19 1630 05/17/19 1730 05/17/19 1800 05/17/19 1939  BP: (!) 141/100 (!) 168/111 140/85 (!) 164/89  Pulse: 69   70  Resp: (!) 22 (!) 22 (!) 28 (!) 26  Temp:    98.7 F (37.1 C)  TempSrc:    Oral  SpO2: 95%   95%  Weight:      Height:      PainSc:        Isolation Precautions No active isolations  Medications Medications  allopurinol (ZYLOPRIM) tablet 100 mg (has no administration in time range)  atorvastatin (LIPITOR) tablet 40 mg (has no administration in time range)  escitalopram (LEXAPRO) tablet 20 mg (has no administration in time range)  mometasone-formoterol (DULERA) 200-5 MCG/ACT inhaler 2 puff (has no administration in time range)  furosemide (LASIX) tablet 20 mg (  has no administration in time range)  metoprolol tartrate (LOPRESSOR) tablet 25 mg (has no administration in time range)  potassium chloride SA (K-DUR) CR tablet 10 mEq (has no administration in time range)  QUEtiapine (SEROQUEL) tablet 25 mg (has no administration in time range)  traZODone (DESYREL) tablet 100 mg (has no administration in time range)  enoxaparin (LOVENOX) injection 40 mg (has no administration in time range)  sodium chloride flush (NS) 0.9 % injection 3 mL (has no administration in time range)  sodium chloride  flush (NS) 0.9 % injection 3 mL (has no administration in time range)  0.9 %  sodium chloride infusion (has no administration in time range)  acetaminophen (TYLENOL) tablet 650 mg (has no administration in time range)    Or  acetaminophen (TYLENOL) suppository 650 mg (has no administration in time range)  polyethylene glycol (MIRALAX / GLYCOLAX) packet 17 g (has no administration in time range)  ondansetron (ZOFRAN) tablet 4 mg (has no administration in time range)    Or  ondansetron (ZOFRAN) injection 4 mg (has no administration in time range)  LORazepam (ATIVAN) injection 1 mg (1 mg Intravenous Given 05/17/19 1938)    Mobility walks with device High fall risk   Focused Assessments   R Recommendations: See Admitting Provider Note  Report given to:   Additional Notes:

## 2019-05-18 NOTE — Progress Notes (Signed)
Pt husband ask RN if he was able to come in to the hospital to see his wife. RN has check with MD and Dawna Part and they agree for pt's husband to come and see her.  Pt has hx of dementia and is confuse and crying asking to see her husband.

## 2019-05-18 NOTE — Progress Notes (Signed)
   05/18/19 2000  Clinical Encounter Type  Visited With Patient  Visit Type Follow-up  Referral From Chaplain  Spiritual Encounters  Spiritual Needs Prayer

## 2019-05-18 NOTE — Progress Notes (Signed)
Dr Mitchell Heir notified of critical potassium 2.5, orders given

## 2019-05-18 NOTE — Progress Notes (Signed)
PT Cancellation Note  Patient Details Name: Deborah Silva MRN: [id number] DOB: [birth year]   Cancelled Treatment:    Reason Eval/Treat Not Completed: Patient not medically ready.  PT consult received.  Chart reviewed.  Pt's potassium noted to be decreased to 2.5 this morning; per PT guidelines for low potassium, exertional PT currently contraindicated.  Will hold PT at this time and re-attempt PT evaluation at a later date/time as medically appropriate.  Leitha Bleak, PT 05/18/19, 10:00 AM [phone]

## 2019-05-19 LAB — BASIC METABOLIC PANEL
Anion gap: 11 (ref 5–15)
BUN: 23 mg/dL (ref 8–23)
CO2: 30 mmol/L (ref 22–32)
Calcium: 9.1 mg/dL (ref 8.9–10.3)
Chloride: 104 mmol/L (ref 98–111)
Creatinine, Ser: 0.94 mg/dL (ref 0.44–1.00)
GFR calc Af Amer: >60 mL/min (ref >60)
GFR calc non Af Amer: >60 mL/min (ref >60)
Glucose, Bld: 145 mg/dL — ABNORMAL HIGH (ref 70–99)
Potassium: 3.3 mmol/L — ABNORMAL LOW (ref 3.5–5.1)
Sodium: 145 mmol/L (ref 135–145)

## 2019-05-19 LAB — HIV ANTIBODY (ROUTINE TESTING W REFLEX): HIV Screen 4th Generation wRfx: NONREACTIVE

## 2019-05-19 MED ORDER — BISACODYL 10 MG RE SUPP
10.0000 mg | Freq: Every day | RECTAL | Status: DC
  Administered 2019-05-19: 10 mg via RECTAL
  Filled 2019-05-19: qty 1

## 2019-05-19 MED ORDER — DOCUSATE SODIUM 100 MG PO CAPS
100.0000 mg | ORAL_CAPSULE | Freq: Two times a day (BID) | ORAL | Status: DC
  Administered 2019-05-19: 100 mg via ORAL
  Filled 2019-05-19: qty 1

## 2019-05-19 NOTE — Progress Notes (Signed)
Advanced Care Plan.  Purpose of Encounter: CODE STATUS. Parties in Attendance: The patient, her husband and me. Patient's Decisional Capacity: No. Medical Story: Deborah Silva  is a 68 y.o. female with a known history of Parkinson's disease, dementia, and asthma.  The patient is admitted for hypoglycemia and weakness.  I discussed with patient's husband about her current condition, prognosis and CODE STATUS.  The patient husband wants the patient to be resuscitated and intubated if she has cardiopulmonary arrest. Plan:  Code Status: Full code. Time spent discussing advance care planning: 17 minutes.

## 2019-05-19 NOTE — Progress Notes (Signed)
[employer] [facility] [place] [facility]   PATIENT NAME: Deborah Silva    MR#:  [id number]  DATE OF BIRTH:  04/18/1951  SUBJECTIVE:  CHIEF COMPLAINT:   Chief Complaint  Patient presents with  . Weakness   The patient is demented and crying.  Her husband is bedside. REVIEW OF SYSTEMS:  Review of Systems  Unable to perform ROS: Dementia    DRUG ALLERGIES:   Allergies  Allergen Reactions  . Naproxen Sodium Shortness Of Breath  . Aminophylline Other (See Comments)    Heart racing  . Penicillins   . Theophyllines Other (See Comments)    Heart racing   VITALS:  Blood pressure (!) 129/53, pulse 63, temperature (!) 97.5 F (36.4 C), temperature source Oral, resp. rate 16, height 5\' 3"  (1.6 m), weight 83.3 kg, SpO2 97 %. PHYSICAL EXAMINATION:  Physical Exam Constitutional:      General: She is not in acute distress. HENT:     Head: Normocephalic.     Mouth/Throat:     Mouth: Mucous membranes are moist.  Eyes:     General: No scleral icterus.    Conjunctiva/sclera: Conjunctivae normal.     Pupils: Pupils are equal, round, and reactive to light.  Neck:     Musculoskeletal: Normal range of motion and neck supple.     Vascular: No JVD.     Trachea: No tracheal deviation.  Cardiovascular:     Rate and Rhythm: Normal rate and regular rhythm.     Heart sounds: Normal heart sounds. No murmur. No gallop.   Pulmonary:     Effort: Pulmonary effort is normal. No respiratory distress.     Breath sounds: Normal breath sounds. No wheezing or rales.  Abdominal:     General: Bowel sounds are normal. There is no distension.     Palpations: Abdomen is soft.     Tenderness: There is no abdominal tenderness. There is no rebound.  Musculoskeletal:        General: No tenderness.  Skin:    Findings: No erythema or rash.  Neurological:     Mental Status: She is alert.     Comments: Unable to exam.  Psychiatric:     Comments: Demented.    LABORATORY PANEL:  Female CBC  Recent Labs  Lab 05/18/19 0426  WBC 6.3  HGB 10.0*  HCT 30.4*  PLT 180   ------------------------------------------------------------------------------------------------------------------ Chemistries  Recent Labs  Lab 05/17/19 1640 05/18/19 0426  05/19/19 0242  NA 141 143  --  145  K 3.3* 2.5*   < > 3.3*  CL 103 104  --  104  CO2 29 30  --  30  GLUCOSE 109* 109*  --  145*  BUN 23 21  --  23  CREATININE 1.14* 0.99  --  0.94  CALCIUM 9.1 8.6*  --  9.1  MG  --  2.0  --   --   AST 21  --   --   --   ALT 17  --   --   --   ALKPHOS 63  --   --   --   BILITOT 0.6  --   --   --    < > = values in this interval not displayed.   RADIOLOGY:  No results found. ASSESSMENT AND PLAN:   1.  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  2.  Inability to ambulate PT and OT evaluation.  3.  Advancing dementia Dysphagia diet. -Aspiration precautions  4.  Parkinson's disease - Sinemet continued - PT and OT consulted for supportive care  5.  Hypokalemia. Continue potassium supplement and follow-up level.  Magnesium is normal.  6.  History of asthma Stable.  Nebulizer PRN.  Urinary retention.  Cath in and out PRN.  All the records are reviewed and case discussed with Care Management/Social Worker. Management plans discussed with the patient's husband and they are in agreement.  CODE STATUS: Full Code  TOTAL TIME TAKING CARE OF THIS PATIENT: 32 minutes.   More than 50% of the time was spent in counseling/coordination of care: YES  POSSIBLE D/C IN 1-2 DAYS, DEPENDING ON CLINICAL CONDITION.   Demetrios Loll M.D on 05/19/2019 at 11:05 AM  Between 7am to 6pm - Pager - [phone]  After 6pm go to [email or web address] - [facility]

## 2019-05-19 NOTE — Evaluation (Addendum)
Clinical/Bedside Swallow Evaluation Patient Details  Name: Deborah Silva MRN: [id number] Date of Birth: [birth year]  Today's Date: 05/19/2019 Time: SLP Start Time (ACUTE ONLY): 0930 SLP Stop Time (ACUTE ONLY): 1045 SLP Time Calculation (min) (ACUTE ONLY): 75 min  Past Medical History:  Past Medical History:  Diagnosis Date  . Asthma   . Dementia ([place])   . Parkinson disease [facility])    Past Surgical History: History reviewed. No pertinent surgical history.   HPI: Per admitting history and physical:   Deborah Silva  is a 68 y.o. female with a known history of Parkinson's disease, dementia, and asthma.  She lives at home with her husband.  He called EMS services for transfer to the emergency room reporting a rapid decrease in her functioning over the last few weeks with patient becoming unable to ambulate over the last 24 hours.  According to EMS services, patient was not able to ambulate requiring 3 people assistance to walk to the stretcher on their arrival to her home.  EMS reported patient complaining of inability to sleep.    Assessment / Plan / Recommendation Clinical Impression  Pt presents with mild oral dysphahia but no s/s of aspiration with any tested consistency. Bedside swallow exam was limited as Pt was constipated and needed to have a bowel movement. Husband reported she had received and enema earlier. Pts husband cares for her at home and was very supportive and helpful during the bedside swallow eval. Oral mech exam revealed structures to be weak but adequate for soft foods. Pt tolerated sips of water by straw along with a few bites of graham cracker without s/s of aspiration. mild oral transit delay with solids but Pt was able to clear oral cavity adequately. Vocal quality remained clear throughout assessment, laryngeal elevation appeared adequate. rec Dysphahia 2 diet with thin liquids for now. Will upgrade to Dysphahgia 3 as overall strength improves. Rec meds in applesauce, may need to  be crushed. SLP Visit Diagnosis: Dysphagia, oropharyngeal phase (R13.12)    Aspiration Risk  Mild aspiration risk    Diet Recommendation Dysphagia 2 (Fine chop)   Liquid Administration via: Straw Medication Administration: Crushed with puree Supervision: Full supervision/cueing for compensatory strategies Compensations: Slow rate;Minimize environmental distractions;Small sips/bites Postural Changes: Seated upright at 90 degrees;Remain upright for at least 30 minutes after po intake    Other  Recommendations     Follow up Recommendations        Frequency and Duration min 2x/week  1 week       Prognosis Prognosis for Safe Diet Advancement: Good Barriers to Reach Goals: Cognitive deficits      Swallow Study   General Type of Study: Bedside Swallow Evaluation Diet Prior to this Study: Dysphagia 1 (puree) Respiratory Status: Room air Behavior/Cognition: Alert;Confused;Requires cueing Oral Cavity Assessment: Within Functional Limits Oral Cavity - Dentition: Edentulous(Has dentures but does not wear) Self-Feeding Abilities: Total assist Patient Positioning: Partially reclined;Other (comment)(Pt not able to sit fully upright today) Baseline Vocal Quality: Normal    Oral/Motor/Sensory Function     Ice Chips Ice chips: Within functional limits   Thin Liquid Thin Liquid: Within functional limits Presentation: Straw    Nectar Thick Nectar Thick Liquid: Not tested   Honey Thick Honey Thick Liquid: Not tested   Puree Puree: Not tested   Solid     Solid: Within functional limits      Deborah Silva 05/19/2019,10:45 AM

## 2019-05-19 NOTE — Evaluation (Signed)
Occupational Therapy Evaluation Patient Details Name: Deborah Silva MRN: [id number] DOB: 04-Feb-1951 Today's Date: 05/19/2019    History of Present Illness 68yo female with PMHx including dementia and Parkinson's admitted for inability to ambulate.   Clinical Impression   Pt seen for OT evaluation this date. Prior to hospital admission, pt was ambulating without AD and getting assist from spouse for IADL, bed level sponge baths, and toileting hygiene.  Pt lives with her spouse in a 1 story home with 5 steps to enter. Spouse present for session, provides all PLOF/home info, and reports that [facility] home health services were in process of getting a ramp approved for the home. Pt presents in bed, eyes closed, grimacing and moaning with spouse attempting to comfort her. Pt briefly opens eyes and redirects with cues. Spouse reports pt recently given something to facilitate a BM because she hasn't had one in almost a week (typically has BM every 4-5 days). Pt repositioned to support improved comfort requiring Max A x2 to perform. Currently pt demonstrates impairments in strength, coordination (UE/LE tremors noted), motor planning/initiation of movement, and pain as demonstrated by the face pain scale. Functional assessment limited by pt's need for BM but difficulty performing. Pt would benefit from skilled OT to address noted impairments and functional limitations (see below for any additional details) in order to maximize safety and independence while minimizing falls risk and caregiver burden.  Upon hospital discharge, recommend pt discharge to STR, pending pt's progress with therapy while hospitalized.    Follow Up Recommendations  SNF    Equipment Recommendations  None recommended by OT    Recommendations for Other Services       Precautions / Restrictions Precautions Precautions: Fall Restrictions Weight Bearing Restrictions: No      Mobility Bed Mobility Overal bed mobility: Needs  Assistance Bed Mobility: Rolling Rolling: Max assist;+2 for physical assistance            Transfers                      Balance                                           ADL either performed or assessed with clinical judgement   ADL Overall ADL's : Needs assistance/impaired                                       General ADL Comments: set up for self feed/drink, mod-max for bed level ADL tasks     Vision Baseline Vision/History: Wears glasses Wears Glasses: At all times Patient Visual Report: No change from baseline Additional Comments: unable to formally assess 2/2 cognition     Perception     Praxis      Pertinent Vitals/Pain Pain Assessment: Faces Faces Pain Scale: Hurts even more Pain Location: unclear, pt unable to state Pain Descriptors / Indicators: Moaning;Grimacing Pain Intervention(s): Limited activity within patient's tolerance;Monitored during session;Repositioned     Hand Dominance Right   Extremity/Trunk Assessment Upper Extremity Assessment Upper Extremity Assessment: Generalized weakness(tremors bilaterally)   Lower Extremity Assessment Lower Extremity Assessment: Defer to PT evaluation;Generalized weakness(intermittent tremors noted bilaterally)       Communication Communication Communication: (some difficulty with communication, unclear if receptive vs HOH)   Cognition Arousal/Alertness:  Awake/alert Behavior During Therapy: Anxious Overall Cognitive Status: History of cognitive impairments - at baseline                                 General Comments: Pt appears anxious and uncomfortable, follows simple commands intermittently, keeps eyes closed through most of session   General Comments       Exercises     Shoulder Instructions      Home Living Family/patient expects to be discharged to:: Private residence Living Arrangements: Spouse/significant other Available Help at  Discharge: Family;Available 24 hours/day Type of Home: House Home Access: Stairs to enter(spouse reports [facility] is in process of getting ramp installed) Entrance Stairs-Number of Steps: 5   Home Layout: One level     Bathroom Shower/Tub: (pt doesn't use)   Bathroom Toilet: Standard(with riser)     Home Equipment: Cane - single point;Toilet riser          Prior Functioning/Environment Level of Independence: Needs assistance  Gait / Transfers Assistance Needed: pt ambulating without AD prior to admission, spouse would assist with toilet transfer ADL's / Homemaking Assistance Needed: Spouse assisted with bed level sponge bath, toilet hygiene, cooking, IADL   Comments: spouse reports pt has stumbled many times but no falls        OT Problem List: Decreased strength;Decreased coordination;Pain;Decreased safety awareness;Decreased cognition;Impaired balance (sitting and/or standing);Decreased knowledge of use of DME or AE      OT Treatment/Interventions: Self-care/ADL training;Therapeutic exercise;Therapeutic activities;Neuromuscular education;DME and/or AE instruction;Patient/family education;Balance training    OT Goals(Current goals can be found in the care plan section) Acute Rehab OT Goals Patient Stated Goal: pt wants to have a BM OT Goal Formulation: With patient/family Time For Goal Achievement: 06/02/19 Potential to Achieve Goals: Good ADL Goals Pt Will Perform Upper Body Dressing: with min assist;sitting Pt Will Perform Lower Body Dressing: sit to/from stand;with mod assist Pt Will Transfer to Toilet: with mod assist;stand pivot transfer  OT Frequency: Min 1X/week   Barriers to D/C: Inaccessible home environment(no ramp in place at this time)          Co-evaluation              AM-PAC OT "6 Clicks" Daily Activity     Outcome Measure Help from another person eating meals?: A Little Help from another person taking care of personal grooming?: A  Little Help from another person toileting, which includes using toliet, bedpan, or urinal?: Total Help from another person bathing (including washing, rinsing, drying)?: A Lot Help from another person to put on and taking off regular upper body clothing?: A Lot Help from another person to put on and taking off regular lower body clothing?: A Lot 6 Click Score: 13   End of Session    Activity Tolerance: Other (comment)(limited by need for BM but having difficulty with this) Patient left: in bed;with call bell/phone within reach;with bed alarm set;with family/visitor present  OT Visit Diagnosis: Other abnormalities of gait and mobility (R26.89);Muscle weakness (generalized) (M62.81);Other symptoms and signs involving the nervous system (R29.898)                Time: [id number] OT Time Calculation (min): 27 min Charges:  OT General Charges $OT Visit: 1 Visit OT Evaluation $OT Eval Moderate Complexity: 1 Mod OT Treatments $Self Care/Home Management : 8-22 mins  Richrd PrimeJamie Stiller, MPH, MS, OTR/L ascom [phone] 05/19/19, 1:06 PM

## 2019-05-19 NOTE — Evaluation (Signed)
Physical Therapy Evaluation Patient Details Name: Deborah Deborah Silva A Silva MRN: [id number] DOB: 08/14/1951 Today's Date: 05/19/2019   History of Present Illness  68yo female with PMHx including dementia and Parkinson's admitted for inability to ambulate.  Clinical Impression  Upon entry to room, patient resting with eyes closed, but responsive to verbal stimuli from therapist.  Limited ability to purposefully communicate with therapist, limited ability to actively follow simple verbal commands.  Bilat UE/LE/trunk generally rigid (unable to purposefully initiate/terminate isolated muscle activity), with poor ability to dissociate extremities from trunk with functional activities.  Currently requiring max/total assist +2 for bed mobility; mod progressing to min assist for unsupported sitting edge of bed; mod/max assist +2 for sit/stand, standing balance and forward/backward stepping with bilat HHA.  Poor balance reactions, high risk for posterior LOB.  Does respond well to manual cuing for balance correction and weight shift; will continue to progress gait efforts as able. Anticipate optimal participation and progress in subsequent sessions with emphasis on functional, automatic mobility tasks due to baseline cognitive deficits. Would benefit from skilled PT to address above deficits and promote optimal return to PLOF; recommend transition to STR upon discharge from acute hospitalization.     Follow Up Recommendations SNF    Equipment Recommendations       Recommendations for Other Services       Precautions / Restrictions Precautions Precautions: Fall Restrictions Weight Bearing Restrictions: No      Mobility  Bed Mobility Overal bed mobility: Needs Assistance Bed Mobility: Rolling;Supine to Sit;Sit to Supine Rolling: Total assist;+2 for physical assistance   Supine to sit: Total assist;+2 for physical assistance Sit to supine: Total assist;+2 for physical assistance   General bed mobility  comments: poor dissociation of extremities from trunk; limited ability to actively assist with bed mobiltiy  Transfers Overall transfer level: Needs assistance Equipment used: 2 person hand held assist Transfers: Sit to/from Stand Sit to Stand: Mod assist;Max assist;+2 physical assistance         General transfer comment: extensive assist for lift off, anterior weight translation and overall balance; poor awareness of midline in A/P plane, limited/no self-righting reactions  Ambulation/Gait Ambulation/Gait assistance: Mod assist;+2 physical assistance Gait Distance (Feet): 4 Feet Assistive device: 2 person hand held assist       General Gait Details: dep assist from therapist for ant/lateral weight shift to unweight/advance LEs; constant manual cuing to guide movement; tends to attempt spontaneous sitting with balance loss  Stairs            Wheelchair Mobility    Modified Rankin (Stroke Patients Only)       Balance Overall balance assessment: Needs assistance Sitting-balance support: No upper extremity supported;Feet supported Sitting balance-Leahy Scale: Poor Sitting balance - Comments: improves to periods of min assist with accommodation to position Postural control: Posterior lean Standing balance support: Bilateral upper extremity supported Standing balance-Leahy Scale: Zero                               Pertinent Vitals/Pain Pain Assessment: Faces Faces Pain Scale: No hurt Pain Location: unclear, pt unable to state Pain Descriptors / Indicators: Moaning;Grimacing Pain Intervention(s): Limited activity within patient's tolerance;Monitored during session;Repositioned    Home Living Family/patient expects to be discharged to:: Private residence Living Arrangements: Spouse/significant other Available Help at Discharge: Family;Available 24 hours/day Type of Home: House Home Access: Stairs to enter   [employer] Corporation[employer]trance Stairs-Number of Steps: 5 Home  Layout: One  level Home Equipment: Cane - single point;Toilet riser      Prior Function Level of Independence: Needs assistance   Gait / Transfers Assistance Needed: pt ambulating without AD prior to admission, spouse would assist with toilet transfer  ADL's / Homemaking Assistance Needed: Spouse assisted with bed level sponge bath, toilet hygiene, cooking, IADL  Comments: spouse reports pt has stumbled many times but no falls     Hand Dominance   Dominant Hand: Right    Extremity/Trunk Assessment   Upper Extremity Assessment Upper Extremity Assessment: Generalized weakness(significant difficulty with purposeful initiation and termination of isolated muscle control; tends to maintain in constant state of muscular co-contraction; poor ability to dissociate with functional activities)    Lower Extremity Assessment Lower Extremity Assessment: Generalized weakness(significant difficulty with purposeful initiation and termination of isolated muscle control; tends to maintain in constant state of muscular co-contraction; poor ability to dissociate with functional activities.  Grossly at least 4-/5)       Communication   Communication: (some difficulty with communication, unclear if receptive vs HOH)  Cognition Arousal/Alertness: Lethargic Behavior During Therapy: Anxious;Flat affect Overall Cognitive Status: No family/caregiver present to determine baseline cognitive functioning                                 General Comments: Pt appears anxious and uncomfortable, follows simple commands intermittently, keeps eyes closed through most of session; relies heavily on tactile cuing/facilitation for communication and movement initiation      General Comments      Exercises Other Exercises Other Exercises: Rolling bilat, max/total assist +2; dep assist for hygiene after incontinent BM Other Exercises: Sit/stand x2 with bilat HHA, mod/max assist +2; progressed to include  dynamic weight shifting, all planes, to improve awareness of midline and initiation of balance reactions Other Exercises: Forward/backward stepping with bilat HHA, mod/max assist +2 for balacne, midline, weight shift and overall task initiation   Assessment/Plan    PT Assessment Patient needs continued PT services  PT Problem List Decreased strength;Decreased range of motion;Decreased activity tolerance;Decreased balance;Decreased mobility;Decreased coordination;Decreased cognition;Decreased knowledge of use of DME;Decreased safety awareness;Decreased knowledge of precautions;Cardiopulmonary status limiting activity       PT Treatment Interventions DME instruction;Gait training;Stair training;Functional mobility training;Therapeutic activities;Therapeutic exercise;Balance training;Neuromuscular re-education;Cognitive remediation;Patient/family education    PT Goals (Current goals can be found in the Care Plan section)  Acute Rehab PT Goals Patient Stated Goal: pt wants to have a BM PT Goal Formulation: Patient unable to participate in goal setting Time For Goal Achievement: 06/02/19 Potential to Achieve Goals: Fair    Frequency Min 2X/week   Barriers to discharge Decreased caregiver support      Co-evaluation               AM-PAC PT "6 Clicks" Mobility  Outcome Measure Help needed turning from your back to your side while in a flat bed without using bedrails?: Total Help needed moving from lying on your back to sitting on the side of a flat bed without using bedrails?: Total Help needed moving to and from a bed to a chair (including a wheelchair)?: Total Help needed standing up from a chair using your arms (e.g., wheelchair or bedside chair)?: Total Help needed to walk in hospital room?: Total Help needed climbing 3-5 steps with a railing? : Total 6 Click Score: 6    End of Session Equipment Utilized During Treatment: Gait belt Activity Tolerance: Patient tolerated  treatment well Patient left: in bed;with call bell/phone within reach;with bed alarm set Nurse Communication: Mobility status PT Visit Diagnosis: Muscle weakness (generalized) (M62.81);Difficulty in walking, not elsewhere classified (R26.2);Other symptoms and signs involving the nervous system (R29.898)    Time: 9432-0037 PT Time Calculation (min) (ACUTE ONLY): 28 min   Charges:   PT Evaluation $PT Eval Moderate Complexity: 1 Mod PT Treatments $Therapeutic Activity: 8-22 mins        Mekiyah Gladwell H. Owens Shark, PT, DPT, NCS 05/19/19, 3:51 PM [phone]

## 2019-05-19 NOTE — TOC Initial Note (Signed)
Transition of Care [facility]) - Initial/Assessment Note    Patient Details  Name: Deborah Silva MRN: [id number] Date of Birth: [birth year]  Transition of [facility] CM/SW Contact:    Beverly Sessions, RN Phone Number: 05/19/2019, 4:32 PM  Clinical Narrative:                 Patient admitted from home with UTI Patient with history of demenita Lives at home with husband who is at bedside.    PCP [facility]  Denies issues with transportation or obtaining medications  Husband states that that patient has a cane and elevated toilet seat in the home.   Husband states that up until a week ago patient was able to ambulate "pretty good".  "Until her legs just stopped working"  Patient open with Company secretary for RN, OT, Russell with [facility] notified of admission  PT, ST, and OT pending while inpatient Per husband he would like to take her home at discharge if possible.  If returns home would benefit from additional DME  Expected Discharge Plan: [place]     Patient Goals and CMS Choice        Expected Discharge Plan and Services Expected Discharge Plan: [place]   Discharge Planning Services: CM Consult   Living arrangements for the past 2 months: Single Family Home                                      Prior Living Arrangements/Services Living arrangements for the past 2 months: Single Family Home Lives with:: Spouse Patient language and need for interpreter reviewed:: Yes Do you feel safe going back to the place where you live?: Yes      Need for Family Participation in Patient Care: Yes (Comment) Care giver support system in place?: Yes (comment) Current home services: DME, Home PT, Home RN Criminal Activity/Legal Involvement Pertinent to Current Situation/Hospitalization: No - Comment as needed  Activities of Daily Living Home Assistive Devices/Equipment: Cane (specify quad or straight) ADL Screening (condition  at time of admission) Patient's cognitive ability adequate to safely complete daily activities?: No Is the patient deaf or have difficulty hearing?: No Does the patient have difficulty seeing, even when wearing glasses/contacts?: Yes Does the patient have difficulty concentrating, remembering, or making decisions?: Yes Patient able to express need for assistance with ADLs?: No Does the patient have difficulty dressing or bathing?: Yes Independently performs ADLs?: No Communication: Independent Dressing (OT): Needs assistance Is this a change from baseline?: Pre-admission baseline Grooming: Needs assistance Is this a change from baseline?: Pre-admission baseline Bathing: Needs assistance Is this a change from baseline?: Pre-admission baseline Toileting: Needs assistance Is this a change from baseline?: Pre-admission baseline In/Out Bed: Needs assistance Is this a change from baseline?: Pre-admission baseline Walks in Home: Needs assistance Is this a change from baseline?: Pre-admission baseline Does the patient have difficulty walking or climbing stairs?: Yes Weakness of Legs: Both Weakness of Arms/Hands: Both  Permission Sought/Granted                  Emotional Assessment Appearance:: Appears stated age     Orientation: : Fluctuating Orientation (Suspected and/or reported Sundowners) Alcohol / Substance Use: Never Used Psych Involvement: No (comment)  Admission diagnosis:  Weakness [R53.1] Dementia with behavioral disturbance, unspecified dementia type ([place]) [F03.91] Patient Active Problem List   Diagnosis  Date Noted  . Unable to ambulate 05/18/2019   PCP:  Lynnea FerrierKlein, Bert J III, MD Pharmacy:   [phone] [place] [place] - [address] [place] [address] Phone: [phone] Fax: [phone]     Social Determinants of Health (SDOH) Interventions    Readmission Risk Interventions No flowsheet data found.

## 2019-05-20 LAB — CBC
HCT: 35 % — ABNORMAL LOW (ref 36.0–46.0)
Hemoglobin: 11.4 g/dL — ABNORMAL LOW (ref 12.0–15.0)
MCH: 30.3 pg (ref 26.0–34.0)
MCHC: 32.6 g/dL (ref 30.0–36.0)
MCV: 93.1 fL (ref 80.0–100.0)
Platelets: 183 10*3/uL (ref 150–400)
RBC: 3.76 MIL/uL — ABNORMAL LOW (ref 3.87–5.11)
RDW: 14.6 % (ref 11.5–15.5)
WBC: 11.8 10*3/uL — ABNORMAL HIGH (ref 4.0–10.5)
nRBC: 0 % (ref 0.0–0.2)

## 2019-05-20 LAB — BASIC METABOLIC PANEL
Anion gap: 9 (ref 5–15)
BUN: 27 mg/dL — ABNORMAL HIGH (ref 8–23)
CO2: 31 mmol/L (ref 22–32)
Calcium: 9.2 mg/dL (ref 8.9–10.3)
Chloride: 104 mmol/L (ref 98–111)
Creatinine, Ser: 0.88 mg/dL (ref 0.44–1.00)
GFR calc Af Amer: >60 mL/min (ref >60)
GFR calc non Af Amer: >60 mL/min (ref >60)
Glucose, Bld: 111 mg/dL — ABNORMAL HIGH (ref 70–99)
Potassium: 3.7 mmol/L (ref 3.5–5.1)
Sodium: 144 mmol/L (ref 135–145)

## 2019-05-20 MED ORDER — DOCUSATE SODIUM 50 MG/5ML PO LIQD
100.0000 mg | Freq: Two times a day (BID) | ORAL | Status: DC
  Administered 2019-05-20 – 2019-05-22 (×2): 100 mg via ORAL
  Filled 2019-05-20 (×6): qty 10

## 2019-05-20 NOTE — Progress Notes (Signed)
[facility] [place] [facility]   PATIENT NAME: Deborah Silva    MR#:  [id number]  DATE OF BIRTH:  07/06/1951  SUBJECTIVE:   Patient gets very emotional on and off. Has history of dementia. Spoke with husband on the phone. Patient states she wants to go home. Baseline confusion present REVIEW OF SYSTEMS:   Review of Systems  Unable to perform ROS: Dementia   Tolerating Diet:yes Tolerating PT: yes  DRUG ALLERGIES:   Allergies  Allergen Reactions  . Naproxen Sodium Shortness Of Breath  . Aminophylline Other (See Comments)    Heart racing  . Penicillins   . Theophyllines Other (See Comments)    Heart racing    VITALS:  Blood pressure (!) 128/100, pulse (!) 102, temperature 99.7 F (37.6 C), temperature source Oral, resp. rate 20, height 5\' 3"  (1.6 m), weight 83.3 kg, SpO2 99 %.  PHYSICAL EXAMINATION:   Physical Exam  GENERAL:  68 y.o.-year-old patient lying in the bed with no acute distress. obese EYES: Pupils equal, round, reactive to light and accommodation. No scleral icterus. Extraocular muscles intact.  HEENT: Head atraumatic, normocephalic. Oropharynx and nasopharynx clear.  NECK:  Supple, no jugular venous distention. No thyroid enlargement, no tenderness.  LUNGS: Normal breath sounds bilaterally, no wheezing, rales, rhonchi. No use of accessory muscles of respiration.  CARDIOVASCULAR: S1, S2 normal. No murmurs, rubs, or gallops.  ABDOMEN: Soft, nontender, nondistended. Bowel sounds present. No organomegaly or mass.  EXTREMITIES: No cyanosis, clubbing or edema b/l.    NEUROLOGIC: Cranial nerves II through XII are intact. No focal Motor or sensory deficits b/l.   PSYCHIATRIC:  patient is alert and oriented x 1. Some baseline confusion present due to dementia SKIN: No obvious rash, lesion, or ulcer.   LABORATORY PANEL:  CBC Recent Labs  Lab 05/20/19 0507  WBC 11.8*  HGB 11.4*  HCT 35.0*  PLT 183    Chemistries  Recent Labs  Lab  05/17/19 1640 05/18/19 0426  05/20/19 0507  NA 141 143   < > 144  K 3.3* 2.5*   < > 3.7  CL 103 104   < > 104  CO2 29 30   < > 31  GLUCOSE 109* 109*   < > 111*  BUN 23 21   < > 27*  CREATININE 1.14* 0.99   < > 0.88  CALCIUM 9.1 8.6*   < > 9.2  MG  --  2.0  --   --   AST 21  --   --   --   ALT 17  --   --   --   ALKPHOS 63  --   --   --   BILITOT 0.6  --   --   --    < > = values in this interval not displayed.   Cardiac Enzymes No results for input(s): TROPONINI in the last 168 hours. RADIOLOGY:  No results found. ASSESSMENT AND PLAN:  Deborah Silva  is a [age] y.o. female with a known history of Parkinson's disease, dementia, and asthma.  She lives at home with her husband.  He called EMS services for transfer to the emergency room reporting a rapid decrease in her functioning over the last few weeks with patient becoming unable to ambulate over the last 24 hours.  1.Dementia with behavioral disturbance, inability to communicate Aspiration  And fall precaution.  2. Inability to ambulate PT and OT evaluation noted husband would like patient to get  physical therapy eval one more time to see where she stands before making decision to take her home versus rehab  3. Advancing dementia Dysphagia diet. -Aspiration precautions  4. Parkinson's disease -Sinemet continued  5. Hypokalemia repleted Continue potassium supplement and follow-up level.  Magnesium is normal.  6. History of asthma Stable.  Nebulizer PRN.  7. Urinary retention.  Cath in and out PRN.  Case discussed with Care Management/Social Worker. Management plans discussed with the  family and they are in agreement.  CODE STATUS: full  DVT Prophylaxis: lovenox  TOTAL TIME TAKING CARE OF THIS PATIENT: 32 minutes.  >50% time spent on counselling and coordination of care  POSSIBLE D/C IN *1-2* DAYS, DEPENDING ON CLINICAL CONDITION.  Note: This dictation was prepared with Dragon dictation along with  smaller phrase technology. Any transcriptional errors that result from this process are unintentional.  Fritzi Mandes M.D on 05/20/2019 at 2:28 PM  Between 7am to 6pm - Pager - [phone]  After 6pm go to [email or web address] - password [email or web address]  [facility]  Office  [phone]  CC: Primary care physician; Adin Hector, MDPatient ID: Deborah Silva, female   DOB: [birth year], 68 y.o.   MRN: [id number]

## 2019-05-20 NOTE — Progress Notes (Signed)
Physical Therapy Treatment Patient Details Name: Deborah Silva MRN: [id number] DOB: 02/23/1951 Today's Date: 05/20/2019    History of Present Illness 68yo female with PMHx including dementia and Parkinson's admitted for inability to ambulate.    PT Comments    PT entered room, pt sleeping, agreeable to attempt walking/sitting up with family and PT encouragement. When moving LE, discovered pt had had a BM, CNA called to assist with pt care. Rolling R and L with total assist x2, pt very distraught/emotional with laying supine and rolling. Despite repositioning, active listening, redirection, and easy/ quiet music, pt refusing any further mobility/unwilling to open her eyes. The patient would benefit from further skilled PT to address acute decline in function to maximize pt mobility, independence, and safety.   Follow Up Recommendations  SNF     Equipment Recommendations  Other (comment)(TBD)    Recommendations for Other Services       Precautions / Restrictions Precautions Precautions: Fall Restrictions Weight Bearing Restrictions: No    Mobility  Bed Mobility Overal bed mobility: Needs Assistance Bed Mobility: Rolling;Supine to Sit;Sit to Supine Rolling: Total assist;+2 for physical assistance         General bed mobility comments: further mobility deferred due to pt agitation and emotional state  Transfers                    Ambulation/Gait                 Stairs             Wheelchair Mobility    Modified Rankin (Stroke Patients Only)       Balance                                            Cognition Arousal/Alertness: Awake/alert Behavior During Therapy: Agitated;Restless;Anxious Overall Cognitive Status: Impaired/Different from baseline                                 General Comments: Pt became very agitated and emotional after pt care to clean up after BM. unable/unwilling to open eyes to  participate further, yelling when encouraged.      Exercises      General Comments        Pertinent Vitals/Pain      Home Living                      Prior Function            PT Goals (current goals can now be found in the care plan section) Progress towards PT goals: Progressing toward goals    Frequency    Min 2X/week      PT Plan Current plan remains appropriate    Co-evaluation              AM-PAC PT "6 Clicks" Mobility   Outcome Measure  Help needed turning from your back to your side while in a flat bed without using bedrails?: Total Help needed moving from lying on your back to sitting on the side of a flat bed without using bedrails?: Total Help needed moving to and from a bed to a chair (including a wheelchair)?: Total Help needed standing up from a chair using your arms (e.g., wheelchair or bedside chair)?: Total  Help needed to walk in hospital room?: Total Help needed climbing 3-5 steps with a railing? : Total 6 Click Score: 6    End of Session   Activity Tolerance: Treatment limited secondary to agitation Patient left: in bed;with call bell/phone within reach;with bed alarm set;with family/visitor present Nurse Communication: Mobility status PT Visit Diagnosis: Muscle weakness (generalized) (M62.81);Difficulty in walking, not elsewhere classified (R26.2);Other symptoms and signs involving the nervous system (R29.898)     Time: 6606-3016 PT Time Calculation (min) (ACUTE ONLY): 18 min  Charges:  $Therapeutic Activity: 8-22 mins                     Lieutenant Diego PT, DPT 3:44 PM,05/20/19 [phone]

## 2019-05-20 NOTE — Progress Notes (Addendum)
   05/20/19 1900  Clinical Encounter Type  Visited With Patient  Visit Type Follow-up  Spiritual Encounters  Spiritual Needs Emotional   Chaplain heard the patient crying out/moaning from the hallway and checked on the patient's needs. Upon arrival, the patient was reclined in bed and appeared to be in some distress. The patient was responsive to this writer's inquiries about her needs. She denied any feeling any pain or discomfort, but indicated that she did not know where her mother was. Chaplain provided compassionate presence and words of encouragement. The patient stated that she "did not want to talk about what was on her mind", but thanked the chaplain for the visit. Follow-up visits would be welcomed.

## 2019-05-20 NOTE — Progress Notes (Signed)
Chart reviewed, Pt visited. Pt resting and did not wish to try Po's with ST. Encouraged her to eat some lunch when her tray arrives. No reported dysphagia but PO's are poor. Husband not present to discuss toleration of new diet. ST to f/u with toleration of diet Monday.

## 2019-05-20 NOTE — Progress Notes (Signed)
Attempted to in and out catheter without success. Will retry after patient rest

## 2019-05-21 MED ORDER — MIRABEGRON ER 25 MG PO TB24
25.0000 mg | ORAL_TABLET | Freq: Every day | ORAL | Status: DC
  Administered 2019-05-22: 25 mg via ORAL
  Filled 2019-05-21 (×2): qty 1

## 2019-05-21 NOTE — [place] FL2 (Signed)
[place] MEDICAID FL2 LEVEL OF CARE SCREENING TOOL     IDENTIFICATION  Patient Name: Deborah Silva Birthdate: 01/28/1951 Sex: female Admission Date (Current Location): 05/17/2019  [place]   Facility and Address:  [facility], [address], [place], [place] [address]      Provider Number: [id number]  Attending Physician Name and Address:  Enedina FinnerPatel, Sona, MD  Relative Name and Phone Number:  Clover MealyBlack,William L Spouse [phone]    Current Level of Care: Hospital Recommended Level of Care: Skilled Nursing Facility Prior Approval Number:    Date Approved/Denied:   PASRR Number: [id number] [id number]  Discharge Plan: SNF    Current Diagnoses: Patient Active Problem List   Diagnosis Date Noted  . Unable to ambulate 05/18/2019    Orientation RESPIRATION BLADDER Height & Weight     Self, Place  Normal Incontinent Weight: 83.3 kg Height:  5\' 3"  (160 cm)  BEHAVIORAL SYMPTOMS/MOOD NEUROLOGICAL BOWEL NUTRITION STATUS      Continent Diet  AMBULATORY STATUS COMMUNICATION OF NEEDS Skin   Extensive Assist Verbally Bruising                       Personal Care Assistance Level of Assistance  Bathing, Feeding, Dressing, Total care Bathing Assistance: Limited assistance Feeding assistance: Independent Dressing Assistance: Maximum assistance Total Care Assistance: Limited assistance   Functional Limitations Info  Sight, Hearing, Speech Sight Info: Adequate Hearing Info: Adequate Speech Info: Adequate    SPECIAL CARE FACTORS FREQUENCY  PT (By licensed PT), OT (By licensed OT)     PT Frequency: 5x per week OT Frequency: 5x per week            Contractures Contractures Info: Not present    Additional Factors Info  Allergies, Code Status Code Status Info: Full code Allergies Info: naproxen, penicillin, theophylline, aminophylline           Current Medications (05/21/2019):  This is the current hospital active  medication list Current Facility-Administered Medications  Medication Dose Route Frequency Provider Last Rate Last Dose  . 0.9 %  sodium chloride infusion  250 mL Intravenous PRN Pearletha AlfredSeals, Angela H, NP   Stopped at 05/18/19 [id number]  . acetaminophen (TYLENOL) tablet 650 mg  650 mg Oral Q6H PRN Seals, Milas Kocherngela H, NP   650 mg at 05/18/19 0250   Or  . acetaminophen (TYLENOL) suppository 650 mg  650 mg Rectal Q6H PRN Seals, Milas KocherAngela H, NP      . allopurinol (ZYLOPRIM) tablet 100 mg  100 mg Oral Daily Seals, Angela H, NP   100 mg at 05/21/19 0911  . atorvastatin (LIPITOR) tablet 40 mg  40 mg Oral Daily Seals, Milas Kocherngela H, NP   40 mg at 05/20/19 1836  . bisacodyl (DULCOLAX) suppository 10 mg  10 mg Rectal Daily Shaune Pollackhen, Qing, MD   10 mg at 05/19/19 0854  . carvedilol (COREG) tablet 6.25 mg  6.25 mg Oral BID WC Shaune Pollackhen, Qing, MD   6.25 mg at 05/21/19 0911  . docusate (COLACE) 50 MG/5ML liquid 100 mg  100 mg Oral BID Enedina FinnerPatel, Sona, MD   100 mg at 05/20/19 1018  . enoxaparin (LOVENOX) injection 40 mg  40 mg Subcutaneous Q24H Seals, Angela H, NP   40 mg at 05/21/19 0912  . escitalopram (LEXAPRO) tablet 20 mg  20 mg Oral Daily Seals, Angela H, NP   20 mg at 05/21/19 0925  . furosemide (LASIX) tablet 20 mg  20  mg Oral Daily Seals, Theo Dills, NP   20 mg at 05/21/19 0911  . hydrALAZINE (APRESOLINE) injection 10 mg  10 mg Intravenous Q6H PRN Demetrios Loll, MD   10 mg at 05/18/19 1134  . mometasone-formoterol (DULERA) 200-5 MCG/ACT inhaler 2 puff  2 puff Inhalation BID Gardiner Barefoot H, NP   2 puff at 05/21/19 0925  . ondansetron (ZOFRAN) tablet 4 mg  4 mg Oral Q6H PRN Seals, Theo Dills, NP       Or  . ondansetron (ZOFRAN) injection 4 mg  4 mg Intravenous Q6H PRN Seals, Theo Dills, NP      . oxyCODONE-acetaminophen (PERCOCET/ROXICET) 5-325 MG per tablet 1 tablet  1 tablet Oral Q6H PRN Demetrios Loll, MD   1 tablet at 05/20/19 [phone]  . polyethylene glycol (MIRALAX / GLYCOLAX) packet 17 g  17 g Oral Daily PRN Seals, Levada Dy H, NP      . potassium  chloride SA (K-DUR) CR tablet 20 mEq  20 mEq Oral BID Gardiner Barefoot H, NP   20 mEq at 05/21/19 0911  . QUEtiapine (SEROQUEL) tablet 25 mg  25 mg Oral QHS Seals, Angela H, NP   25 mg at 05/20/19 2123  . senna (SENOKOT) tablet 8.6 mg  1 tablet Oral Daily PRN Demetrios Loll, MD   8.6 mg at 05/19/19 2055  . sodium chloride flush (NS) 0.9 % injection 3 mL  3 mL Intravenous Q12H Seals, Angela H, NP   3 mL at 05/21/19 0911  . sodium chloride flush (NS) 0.9 % injection 3 mL  3 mL Intravenous PRN Seals, Theo Dills, NP         Discharge Medications: Please see discharge summary for a list of discharge medications.  Relevant Imaging Results:  Relevant Lab Results:   Additional Information ss [id number]  Latanya Maudlin, RN

## 2019-05-21 NOTE — TOC Progression Note (Signed)
Transition of Care [facility]) - Progression Note    Patient Details  Name: Deborah Silva MRN: [id number] Date of Birth: Oct 02, 1951  [facility] CM/SW Contact  Latanya Maudlin, RN Phone Number: 05/21/2019, 10:25 AM  Clinical Narrative: [facility] team spoke with patients spouse as he is increasingly concerned about taking the patient home. Spouse originally wanted to take the patient home with home health. [facility] is following already. However, patient has been tearful and has decline in her abilities physically here at the hospital per PT. Spouse is a the bedside and witnessed this yesterday during PT session. Per him he still is hopeful she will progress before dc but acknowledges he cannot care for her in her current state. PASSAR obtained, FL2 done and submitted bed offers through the [place]. Will notify patient and spouse once offers are received.        Expected Discharge Plan: [place]    Expected Discharge Plan and Services Expected Discharge Plan: [place]   Discharge Planning Services: CM Consult Post Acute Care Choice: [place] Living arrangements for the past 2 months: Mobile Home                                       Social Determinants of Health (SDOH) Interventions    Readmission Risk Interventions Readmission Risk Prevention Plan 05/21/2019  Transportation Screening Complete  Some recent data might be hidden

## 2019-05-21 NOTE — Progress Notes (Signed)
Physical Therapy Treatment Patient Details Name: Deborah Silva MRN: [id number] DOB: 09/11/1951 Today's Date: 05/21/2019    History of Present Illness 68yo female with PMHx including dementia and Parkinson's admitted for inability to ambulate.    PT Comments    Patient easily woken, tearful and anxious throughout session, but overall improved mobility compared to previous sessions. Husband in room to assist with pt as needed. Bed mobility performed with maxA x2 for safety. Pt progressed to sitting EOB with minA (handheld assist). Sit <> Stand and ~203ft ambulation performed with minAx2 and handheld assist. Pt needed constant verbal and tactile cues to attend to task/address anxiety. Pt up in chair with family at bedside, with some emotional lability noted. PT and husband discussed PT POC, pt progress, and STR. Overall the patient demonstrated improvements towards goals this session but current plans remains appropriate to maximize pt independence, safety, and mobility.     Follow Up Recommendations  SNF     Equipment Recommendations  Other (comment)    Recommendations for Other Services       Precautions / Restrictions Precautions Precautions: Fall Restrictions Weight Bearing Restrictions: No    Mobility  Bed Mobility Overal bed mobility: Needs Assistance Bed Mobility: Supine to Sit     Supine to sit: +2 for physical assistance;Max assist        Transfers Overall transfer level: Needs assistance Equipment used: 2 person hand held assist Transfers: Sit to/from Stand Sit to Stand: Min assist;+2 physical assistance            Ambulation/Gait Ambulation/Gait assistance: Min assist;+2 physical assistance Gait Distance (Feet): 3 Feet Assistive device: 2 person hand held assist       General Gait Details: very short, shuffled steps, constant verbal/tactile cues for pt to attend to task/address pt anxiety. minAx2 to maintain safety   Stairs              Wheelchair Mobility    Modified Rankin (Stroke Patients Only)       Balance Overall balance assessment: Needs assistance Sitting-balance support: No upper extremity supported;Feet supported Sitting balance-Leahy Scale: Poor Sitting balance - Comments: at least hand held assist to maintain balance seated EOB Postural control: Posterior lean Standing balance support: Bilateral upper extremity supported Standing balance-Leahy Scale: Poor                              Cognition Arousal/Alertness: Awake/alert Behavior During Therapy: Restless;Anxious;Flat affect Overall Cognitive Status: Impaired/Different from baseline                                        Exercises      General Comments        Pertinent Vitals/Pain Pain Assessment: Faces Faces Pain Scale: No hurt Pain Location: unclear, pt unable to state    Home Living                      Prior Function            PT Goals (current goals can now be found in the care plan section) Progress towards PT goals: Progressing toward goals    Frequency    Min 2X/week      PT Plan Current plan remains appropriate    Co-evaluation  AM-PAC PT "6 Clicks" Mobility   Outcome Measure  Help needed turning from your back to your side while in a flat bed without using bedrails?: A Lot Help needed moving from lying on your back to sitting on the side of a flat bed without using bedrails?: A Lot Help needed moving to and from a bed to a chair (including a wheelchair)?: A Lot Help needed standing up from a chair using your arms (e.g., wheelchair or bedside chair)?: A Lot Help needed to walk in hospital room?: A Lot Help needed climbing 3-5 steps with a railing? : Total 6 Click Score: 11    End of Session Equipment Utilized During Treatment: Gait belt Activity Tolerance: Patient tolerated treatment well Patient left: with call bell/phone within reach;with  family/visitor present;in chair;with chair alarm set Nurse Communication: Mobility status PT Visit Diagnosis: Muscle weakness (generalized) (M62.81);Difficulty in walking, not elsewhere classified (R26.2);Other symptoms and signs involving the nervous system (R29.898)     Time: 1751-0258 PT Time Calculation (min) (ACUTE ONLY): 23 min  Charges:  $Therapeutic Exercise: 23-37 mins                     Lieutenant Diego PT, DPT 11:13 AM,05/21/19 [phone]

## 2019-05-21 NOTE — Progress Notes (Signed)
Patient unable to void, assisted to bedside commode with help from patient's husband. Patient was unable to help with transfer from bed to bsc. Patient sat on bedside commode for several minutes but was still unable to void. Patient was assisted back to bed and in and out cath was performed. Patient tolerated I/O  Cath well 722ml of dark, foul smelling urine returned. Terrial Rhodes

## 2019-05-21 NOTE — Progress Notes (Addendum)
[facility] [place] NAME: Deborah Silva    MR#:  [id number]  DATE OF BIRTH:  23-Mar-1951  SUBJECTIVE:   Patient gets very emotional on and off. Has history of dementia. Spoke with husband in the room. Patient is tearful because she does not order set out in the chair. No other issues per RN REVIEW OF SYSTEMS:   Review of Systems  Unable to perform ROS: Dementia   Tolerating Diet:yes Tolerating PT: yes  DRUG ALLERGIES:   Allergies  Allergen Reactions  . Naproxen Sodium Shortness Of Breath  . Aminophylline Other (See Comments)    Heart racing  . Penicillins   . Theophyllines Other (See Comments)    Heart racing    VITALS:  Blood pressure 103/61, pulse 74, temperature 99.4 F (37.4 C), temperature source Oral, resp. rate 20, height 5\' 3"  (1.6 m), weight 83.3 kg, SpO2 95 %.  PHYSICAL EXAMINATION:   Physical Exam  GENERAL:  68 y.o.-year-old patient lying in the bed with no acute distress. obese EYES: Pupils equal, round, reactive to light and accommodation. No scleral icterus. Extraocular muscles intact.  HEENT: Head atraumatic, normocephalic. Oropharynx and nasopharynx clear.  NECK:  Supple, no jugular venous distention. No thyroid enlargement, no tenderness.  LUNGS: Normal breath sounds bilaterally, no wheezing, rales, rhonchi. No use of accessory muscles of respiration.  CARDIOVASCULAR: S1, S2 normal. No murmurs, rubs, or gallops.  ABDOMEN: Soft, nontender, nondistended. Bowel sounds present. No organomegaly or mass.  EXTREMITIES: No cyanosis, clubbing or edema b/l.    NEUROLOGIC: Cranial nerves II through XII are intact. No focal Motor or sensory deficits b/l.   PSYCHIATRIC:  patient is alert and oriented x 1. Some baseline confusion present due to dementia SKIN: No obvious rash, lesion, or ulcer.   LABORATORY PANEL:  CBC Recent Labs  Lab 05/20/19 0507  WBC 11.8*  HGB 11.4*  HCT 35.0*  PLT 183    Chemistries   Recent Labs  Lab 05/17/19 1640 05/18/19 0426  05/20/19 0507  NA 141 143   < > 144  K 3.3* 2.5*   < > 3.7  CL 103 104   < > 104  CO2 29 30   < > 31  GLUCOSE 109* 109*   < > 111*  BUN 23 21   < > 27*  CREATININE 1.14* 0.99   < > 0.88  CALCIUM 9.1 8.6*   < > 9.2  MG  --  2.0  --   --   AST 21  --   --   --   ALT 17  --   --   --   ALKPHOS 63  --   --   --   BILITOT 0.6  --   --   --    < > = values in this interval not displayed.   Cardiac Enzymes No results for input(s): TROPONINI in the last 168 hours. RADIOLOGY:  No results found. ASSESSMENT AND PLAN:  Deborah Silva  is a 68 y.o. female with a known history of Parkinson's disease, dementia, and asthma.  She lives at home with her husband.  He called EMS services for transfer to the emergency room reporting a rapid decrease in her functioning over the last few weeks with patient becoming unable to ambulate over the last 24 hours.  1.Dementia with behavioral disturbance, inability to communicate Aspiration  And fall precaution.  2. Inability to ambulate PT and OT evaluation noted  patient is still requiring quite a bit of assist. Discussed with husband he will discussed with care manager and figure out discharge plan home health versus rehab. Husband would like visitation to the rehab place. I have asked him to discuss with care manager.  3. Advancing dementia Dysphagia diet. -Aspiration precautions  4. Parkinson's disease -Sinemet continued  5. Hypokalemia repleted Continue potassium supplement and follow-up level.  Magnesium is normal.  6. History of asthma Stable.  Nebulizer PRN.  7. Urinary retention.  Cath in and out PRN. This likely is due to her Parkinson's disease. Will give trial of Myrbetriq 25 mg qd   Case discussed with Care Management/Social Worker. Management plans discussed with the family  CODE STATUS: full  DVT Prophylaxis: lovenox  TOTAL TIME TAKING CARE OF THIS PATIENT: 32  minutes.  >50% time spent on counselling and coordination of care  POSSIBLE D/C IN *1-2* DAYS, DEPENDING ON CLINICAL CONDITION.  Note: This dictation was prepared with Dragon dictation along with smaller phrase technology. Any transcriptional errors that result from this process are unintentional.  Enedina FinnerSona Leith Szafranski M.D on 05/21/2019 at 12:38 PM  Between 7am to 6pm - Pager - [phone]  After 6pm go to [email or web address] - password [facility]  [facility]  Office  [phone]  CC: Primary care physician; Lynnea FerrierKlein, Bert J III, MDPatient ID: Deborah PoissonMary A Silva, female   DOB: 09/17/1951, 68 y.o.   MRN: [id number]

## 2019-05-22 LAB — SARS CORONAVIRUS 2 BY RT PCR (HOSPITAL ORDER, PERFORMED IN ~~LOC~~ HOSPITAL LAB): SARS Coronavirus 2: NEGATIVE

## 2019-05-22 LAB — ABO/RH: ABO/RH(D): O POS

## 2019-05-22 MED ORDER — OXYCODONE-ACETAMINOPHEN 5-325 MG PO TABS: 1.0000 | ORAL_TABLET | Freq: Three times a day (TID) | ORAL | 0 refills | Status: AC | PRN

## 2019-05-22 MED ORDER — CARVEDILOL 6.25 MG PO TABS: 6.2500 mg | ORAL_TABLET | Freq: Two times a day (BID) | ORAL | 1 refills | Status: AC

## 2019-05-22 MED ORDER — MIRABEGRON ER 25 MG PO TB24: 25.0000 mg | ORAL_TABLET | Freq: Every day | ORAL | 0 refills | Status: AC

## 2019-05-22 NOTE — TOC Transition Note (Signed)
Transition of Care [facility]) - CM/SW Discharge Note   Patient Details  Name: [name] MRN: [id number] Date of Birth: 09/11/1951  Transition of Care [facility]) CM/SW Contact:  Beverly Sessions, RN Phone Number: 05/22/2019, 4:14 PM   Clinical Narrative:     [facility] provided to Husband.  He accepted.  Insurance Josem Kaufmann was obtained by Claiborne Billings at [employer].  Patient to discharge today/  Will transport by EMS  Packet placed on chart  Final next level of care: Skilled Nursing Facility Barriers to Discharge: Barriers Resolved   Patient Goals and CMS Choice     Choice offered to / list presented to : Spouse  Discharge Placement              Patient chooses bed at: [facility] Patient to be transferred to facility by: EMS Name of family member notified: Husband at bedside Patient and family notified of of transfer: 05/22/19  Discharge Plan and Services   Discharge Planning Services: CM Consult Post Acute Care Choice: [facility]                               Social Determinants of Health ([place]) Interventions     Readmission Risk Interventions Readmission Risk Prevention Plan 05/21/2019  Transportation Screening Complete  Some recent data might be hidden

## 2019-05-22 NOTE — Discharge Instructions (Signed)
Encourage patient to use bedside commode. As needed in and out cath TID PRN

## 2019-05-22 NOTE — Discharge Summary (Signed)
[facility] -  at [facility]   PATIENT NAME: Deborah Silva    MR#:  [id number]  DATE OF BIRTH:  02/28/1951  DATE OF ADMISSION:  05/17/2019 ADMITTING PHYSICIAN: Deborah BeatJan A Mansy, MD  DATE OF DISCHARGE: 05/22/2019  PRIMARY CARE PHYSICIAN: Deborah Silva, Deborah J III, MD    ADMISSION DIAGNOSIS:  Weakness [R53.1] Dementia with behavioral disturbance, unspecified dementia type (HCC) [F03.91]  DISCHARGE DIAGNOSIS:  generalized weakness dementia with behavioral issues SECONDARY DIAGNOSIS:   Past Medical History:  Diagnosis Date  . Asthma   . Dementia (HCC)   . Parkinson disease [facility](HCC)     HOSPITAL COURSE:   Deborah Silva a67 y.o.femalewith a known history of Parkinson's disease, dementia, and asthma. She lives at home with her husband. He called EMS services for transfer to the emergency room reporting a rapid decrease in her functioning over the last few weeks with patient becoming unable to ambulate over the last 24 hours.  1.Dementia with behavioral disturbance, inability to communicate Aspiration And fall precaution.  2. Inability to ambulate PT and OT evaluation noted--STR patient is still requiring quite a bit of assist.   3. Advancing dementia Dysphagia diet. -Aspiration precautions  4. Parkinson's disease -Sinemet continued  5. Hypokalemia repleted Continue potassium supplement and follow-up level. Magnesium is normal.  6. History of asthma Stable. Nebulizer PRN.  7. Urinary retention. Cath in and out PRN. This likely is due to her Parkinson's disease. Will give trial of Myrbetriq 25 mg qd  CONSULTS OBTAINED:    DRUG ALLERGIES:   Allergies  Allergen Reactions  . Naproxen Sodium Shortness Of Breath  . Aminophylline Other (See Comments)    Heart racing  . Penicillins   . Theophyllines Other (See Comments)    Heart racing    DISCHARGE MEDICATIONS:   Allergies as of 05/22/2019      Reactions   Naproxen Sodium  Shortness Of Breath   Aminophylline Other (See Comments)   Heart racing   Penicillins    Theophyllines Other (See Comments)   Heart racing      Medication List    STOP taking these medications   metoprolol tartrate 25 MG tablet Commonly known as: LOPRESSOR     TAKE these medications   allopurinol 100 MG tablet Commonly known as: ZYLOPRIM Take 100 mg by mouth daily.   atorvastatin 40 MG tablet Commonly known as: LIPITOR Take 40 mg by mouth daily.   carvedilol 6.25 MG tablet Commonly known as: COREG Take 1 tablet (6.25 mg total) by mouth 2 (two) times daily with a meal.   doxepin 10 MG capsule Commonly known as: SINEQUAN Take 10 mg by mouth at bedtime as needed.   escitalopram 20 MG tablet Commonly known as: LEXAPRO Take 20 mg by mouth daily.   furosemide 20 MG tablet Commonly known as: LASIX Take 20 mg by mouth daily.   mirabegron ER 25 MG Tb24 tablet Commonly known as: MYRBETRIQ Take 1 tablet (25 mg total) by mouth daily. Start taking on: May 23, 2019   oxyCODONE-acetaminophen 5-325 MG tablet Commonly known as: PERCOCET/ROXICET Take 1 tablet by mouth every 8 (eight) hours as needed for moderate pain.   potassium chloride 10 MEQ tablet Commonly known as: K-DUR Take 10 mEq by mouth 2 (two) times a day.   QUEtiapine 25 MG tablet Commonly known as: SEROQUEL Take 25 mg by mouth at bedtime.   traZODone 100 MG tablet Commonly known as: DESYREL Take 100 mg by mouth Nightly.   Wixela  Inhub 250-50 MCG/DOSE Aepb Generic drug: Fluticasone-Salmeterol Inhale 1 puff into the lungs every 12 (twelve) hours.       If you experience worsening of your admission symptoms, develop shortness of breath, life threatening emergency, suicidal or homicidal thoughts you must seek medical attention immediately by calling 911 or calling your MD immediately  if symptoms less severe.  You Must read complete instructions/literature along with all the possible adverse  reactions/side effects for all the Medicines you take and that have been prescribed to you. Take any new Medicines after you have completely understood and accept all the possible adverse reactions/side effects.   Please note  You were cared for by a hospitalist during your hospital stay. If you have any questions about your discharge medications or the care you received while you were in the hospital after you are discharged, you can call the unit and asked to speak with the hospitalist on call if the hospitalist that took care of you is not available. Once you are discharged, your primary care physician will handle any further medical issues. Please note that NO REFILLS for any discharge medications will be authorized once you are discharged, as it is imperative that you return to your primary care physician (or establish a relationship with a primary care physician if you do not have one) for your aftercare needs so that they can reassess your need for medications and monitor your lab values. Today   SUBJECTIVE   Husband in the room. Pt emotional periodically VITAL SIGNS:  Blood pressure (!) 129/53, pulse 70, temperature 98.5 F (36.9 C), temperature source Oral, resp. rate 20, height 5\' 3"  (1.6 m), weight 83.3 kg, SpO2 97 %.  I/O:    Intake/Output Summary (Last 24 hours) at 05/22/2019 1344 Last data filed at 05/22/2019 0900 Gross per 24 hour  Intake 120 ml  Output 500 ml  Net -380 ml    PHYSICAL EXAMINATION:  GENERAL:  68 y.o.-year-old patient lying in the bed with no acute distress. obese EYES: Pupils equal, round, reactive to light and accommodation. No scleral icterus. Extraocular muscles intact.  HEENT: Head atraumatic, normocephalic. Oropharynx and nasopharynx clear.  NECK:  Supple, no jugular venous distention. No thyroid enlargement, no tenderness.  LUNGS: Normal breath sounds bilaterally, no wheezing, rales,rhonchi or crepitation. No use of accessory muscles of respiration.   CARDIOVASCULAR: S1, S2 normal. No murmurs, rubs, or gallops.  ABDOMEN: Soft, non-tender, non-distended. Bowel sounds present. No organomegaly or mass.  EXTREMITIES: No pedal edema, cyanosis, or clubbing.  NEUROLOGIC: Cranial nerves II through XII are intact. Muscle strength 5/5 in all extremities. Sensation intact. Gait not checked.  PSYCHIATRIC: The patient is alert and oriented x 1  SKIN: No obvious rash, lesion, or ulcer.   DATA REVIEW:   CBC  Recent Labs  Lab 05/20/19 0507  WBC 11.8*  HGB 11.4*  HCT 35.0*  PLT 183    Chemistries  Recent Labs  Lab 05/17/19 1640 05/18/19 0426  05/20/19 0507  NA 141 143   < > 144  K 3.3* 2.5*   < > 3.7  CL 103 104   < > 104  CO2 29 30   < > 31  GLUCOSE 109* 109*   < > 111*  BUN 23 21   < > 27*  CREATININE 1.14* 0.99   < > 0.88  CALCIUM 9.1 8.6*   < > 9.2  MG  --  2.0  --   --   AST 21  --   --   --  ALT 17  --   --   --   ALKPHOS 63  --   --   --   BILITOT 0.6  --   --   --    < > = values in this interval not displayed.    Microbiology Results   Recent Results (from the past 240 hour(s))  SARS Coronavirus 2 (CEPHEID - Performed in [facility] hospital lab), Hosp Order     Status: None   Collection Time: 05/17/19 11:03 PM   Specimen: Nasopharyngeal Swab  Result Value Ref Range Status   SARS Coronavirus 2 NEGATIVE NEGATIVE Final    Comment: (NOTE) If result is NEGATIVE SARS-CoV-2 target nucleic acids are NOT DETECTED. The SARS-CoV-2 RNA is generally detectable in upper and lower  respiratory specimens during the acute phase of infection. The lowest  concentration of SARS-CoV-2 viral copies this assay can detect is 250  copies / mL. A negative result does not preclude SARS-CoV-2 infection  and should not be used as the sole basis for treatment or other  patient management decisions.  A negative result may occur with  improper specimen collection / handling, submission of specimen other  than nasopharyngeal swab, presence  of viral mutation(s) within the  areas targeted by this assay, and inadequate number of viral copies  (<250 copies / mL). A negative result must be combined with clinical  observations, patient history, and epidemiological information. If result is POSITIVE SARS-CoV-2 target nucleic acids are DETECTED. The SARS-CoV-2 RNA is generally detectable in upper and lower  respiratory specimens dur ing the acute phase of infection.  Positive  results are indicative of active infection with SARS-CoV-2.  Clinical  correlation with patient history and other diagnostic information is  necessary to determine patient infection status.  Positive results do  not rule out bacterial infection or co-infection with other viruses. If result is PRESUMPTIVE POSTIVE SARS-CoV-2 nucleic acids MAY BE PRESENT.   A presumptive positive result was obtained on the submitted specimen  and confirmed on repeat testing.  While 2019 novel coronavirus  (SARS-CoV-2) nucleic acids may be present in the submitted sample  additional confirmatory testing may be necessary for epidemiological  and / or clinical management purposes  to differentiate between  SARS-CoV-2 and other Sarbecovirus currently known to infect humans.  If clinically indicated additional testing with an alternate test  methodology [phone]) is advised. The SARS-CoV-2 RNA is generally  detectable in upper and lower respiratory sp ecimens during the acute  phase of infection. The expected result is Negative. Fact Sheet for Patients:  StrictlyIdeas.no Fact Sheet for Healthcare Providers: BankingDealers.co.za This test is not yet approved or cleared by the [place] FDA and has been authorized for detection and/or diagnosis of SARS-CoV-2 by FDA under an Emergency Use Authorization (EUA).  This EUA will remain in effect (meaning this test can be used) for the duration of the COVID-19 declaration under Section  564(b)(1) of the Act, 21 U.S.C. section 360bbb-3(b)(1), unless the authorization is terminated or revoked sooner. Performed at [facility], [address] [place], [place] [address]   [facility] order, Performed in [facility] hospital lab)     Status: None   Collection Time: 05/22/19 10:31 AM   Specimen: Nasopharyngeal Swab  Result Value Ref Range Status   SARS Coronavirus 2 NEGATIVE NEGATIVE Final    Comment: (NOTE) If result is NEGATIVE SARS-CoV-2 target nucleic acids are NOT DETECTED. The SARS-CoV-2 RNA is generally detectable in upper and  lower  respiratory specimens during the acute phase of infection. The lowest  concentration of SARS-CoV-2 viral copies this assay can detect is 250  copies / mL. A negative result does not preclude SARS-CoV-2 infection  and should not be used as the sole basis for treatment or other  patient management decisions.  A negative result may occur with  improper specimen collection / handling, submission of specimen other  than nasopharyngeal swab, presence of viral mutation(s) within the  areas targeted by this assay, and inadequate number of viral copies  (<250 copies / mL). A negative result must be combined with clinical  observations, patient history, and epidemiological information. If result is POSITIVE SARS-CoV-2 target nucleic acids are DETECTED. The SARS-CoV-2 RNA is generally detectable in upper and lower  respiratory specimens dur ing the acute phase of infection.  Positive  results are indicative of active infection with SARS-CoV-2.  Clinical  correlation with patient history and other diagnostic information is  necessary to determine patient infection status.  Positive results do  not rule out bacterial infection or co-infection with other viruses. If result is PRESUMPTIVE POSTIVE SARS-CoV-2 nucleic acids MAY BE PRESENT.   A presumptive positive result was obtained on the submitted specimen  and  confirmed on repeat testing.  While 2019 novel coronavirus  (SARS-CoV-2) nucleic acids may be present in the submitted sample  additional confirmatory testing may be necessary for epidemiological  and / or clinical management purposes  to differentiate between  SARS-CoV-2 and other Sarbecovirus currently known to infect humans.  If clinically indicated additional testing with an alternate test  methodology [phone]) is advised. The SARS-CoV-2 RNA is generally  detectable in upper and lower respiratory sp ecimens during the acute  phase of infection. The expected result is Negative. Fact Sheet for Patients:  BoilerBrush.com.cyhttps://www.fda.gov/media/136312/download Fact Sheet for Healthcare Providers: https://pope.com/https://www.fda.gov/media/136313/download This test is not yet approved or cleared by the [place] FDA and has been authorized for detection and/or diagnosis of SARS-CoV-2 by FDA under an Emergency Use Authorization (EUA).  This EUA will remain in effect (meaning this test can be used) for the duration of the COVID-19 declaration under Section 564(b)(1) of the Act, 21 U.S.C. section 360bbb-3(b)(1), unless the authorization is terminated or revoked sooner. Performed at [facility], [address] [place], [place] [address]     RADIOLOGY:  No results found.   CODE STATUS:     Code Status Orders  (From admission, onward)         Start     Ordered   05/18/19 0030  Full code  Continuous     05/18/19 0029        Code Status History    This patient has a current code status but no historical code status.   Advance Care Planning Activity      TOTAL TIME TAKING CARE OF THIS PATIENT: *40* minutes.    Enedina FinnerSona Audy Dauphine M.D on 05/22/2019 at 1:44 PM  Between 7am to 6pm - Pager - [phone] After 6pm go to [email or web address] - password [facility]  [facility] [place] [facility]  Office  [phone]  CC: Primary care physician; Deborah Silva, Deborah J III, MD

## 2019-05-22 NOTE — Progress Notes (Signed)
Patient has 514 on bladder scan. Gardiner Barefoot, NP notified and requested order for foley as patient has required an in and out cath at least once a day since the 16th.

## 2019-05-22 NOTE — Care Management Important Message (Signed)
Important Message  Patient Details  Name: CAMBREY LUPI MRN: [id number] Date of Birth: [birth year]   Medicare Important Message Given:  Yes     Dannette Barbara 05/22/2019, 12:00 PM

## 2019-05-22 NOTE — Progress Notes (Signed)
[facility] - [place] at [facility]   PATIENT NAME: Deborah Silva    MR#:  [id number]  DATE OF BIRTH:  07/31/1951  SUBJECTIVE:   Patient gets very emotional on and off. Has history of dementia. Spoke with husband in the room.No other issues per RN REVIEW OF SYSTEMS:   Review of Systems  Unable to perform ROS: Dementia   Tolerating Diet:yes Tolerating PT: yes  DRUG ALLERGIES:   Allergies  Allergen Reactions  . Naproxen Sodium Shortness Of Breath  . Aminophylline Other (See Comments)    Heart racing  . Penicillins   . Theophyllines Other (See Comments)    Heart racing    VITALS:  Blood pressure (!) 158/49, pulse 75, temperature 98 F (36.7 C), temperature source Oral, resp. rate 20, height 5\' 3"  (1.6 m), weight 83.3 kg, SpO2 93 %.  PHYSICAL EXAMINATION:   Physical Exam  GENERAL:  68 y.o.-year-old patient lying in the bed with no acute distress. obese EYES: Pupils equal, round, reactive to light and accommodation. No scleral icterus. Extraocular muscles intact.  HEENT: Head atraumatic, normocephalic. Oropharynx and nasopharynx clear.  NECK:  Supple, no jugular venous distention. No thyroid enlargement, no tenderness.  LUNGS: Normal breath sounds bilaterally, no wheezing, rales, rhonchi. No use of accessory muscles of respiration.  CARDIOVASCULAR: S1, S2 normal. No murmurs, rubs, or gallops.  ABDOMEN: Soft, nontender, nondistended. Bowel sounds present. No organomegaly or mass.  EXTREMITIES: No cyanosis, clubbing or edema b/l.    NEUROLOGIC: Cranial nerves II through XII are intact. No focal Motor or sensory deficits b/l.   PSYCHIATRIC:  patient is alert and oriented x 1. Some baseline confusion present due to dementia SKIN: No obvious rash, lesion, or ulcer.   LABORATORY PANEL:  CBC Recent Labs  Lab 05/20/19 0507  WBC 11.8*  HGB 11.4*  HCT 35.0*  PLT 183    Chemistries  Recent Labs  Lab 05/17/19 1640 05/18/19 0426  05/20/19 0507  NA  141 143   < > 144  K 3.3* 2.5*   < > 3.7  CL 103 104   < > 104  CO2 29 30   < > 31  GLUCOSE 109* 109*   < > 111*  BUN 23 21   < > 27*  CREATININE 1.14* 0.99   < > 0.88  CALCIUM 9.1 8.6*   < > 9.2  MG  --  2.0  --   --   AST 21  --   --   --   ALT 17  --   --   --   ALKPHOS 63  --   --   --   BILITOT 0.6  --   --   --    < > = values in this interval not displayed.   Cardiac Enzymes No results for input(s): TROPONINI in the last 168 hours. RADIOLOGY:  No results found. ASSESSMENT AND PLAN:  Deborah FireMary Silva  is a 68 y.o. female with a known history of Parkinson's disease, dementia, and asthma.  She lives at home with her husband.  He called EMS services for transfer to the emergency room reporting a rapid decrease in her functioning over the last few weeks with patient becoming unable to ambulate over the last 24 hours.  1.Dementia with behavioral disturbance, inability to communicate Aspiration And fall precaution.  2. Inability to ambulate PT and OT evaluation noted patient is still requiring quite a bit of assist. Discussed with husband he will  discussed with care manager and figure out discharge plan home health versus rehab. Husband would like visitation to the rehab place. I have asked him to discuss with care manager.  3. Advancing dementia Dysphagia diet. -Aspiration precautions  4. Parkinson's disease -Sinemet continued  5. Hypokalemia repleted Continue potassium supplement and follow-up level.  Magnesium is normal.  6. History of asthma Stable.  Nebulizer PRN.  7. Urinary retention.  Cath in and out PRN. This likely is due to her Parkinson's disease. Will give trial of Myrbetriq 25 mg qd  Patient is medically best at baseline and ready for discharge. Awaiting for insurance authorization. Care manager spoke with husband regarding discharge plan and he wants to consider rehab at present.  Case discussed with Care Management/Social Worker. Management  plans discussed with the family  CODE STATUS: full  DVT Prophylaxis: lovenox  TOTAL TIME TAKING CARE OF THIS PATIENT: 32 minutes.  >50% time spent on counselling and coordination of care    Note: This dictation was prepared with Dragon dictation along with smaller phrase technology. Any transcriptional errors that result from this process are unintentional.  Fritzi Mandes M.D on 05/22/2019 at 9:55 AM  Between 7am to 6pm - Pager - [phone]  After 6pm go to [email or web address] - password [email or web address]  [facility]  Office  [phone]  CC: Primary care physician; Adin Hector, MDPatient ID: Deborah Silva, female   DOB: 05/26/1951, 68 y.o.   MRN: [id number]

## 2019-05-22 NOTE — Progress Notes (Signed)
Report called to Jana Half, Therapist, sports, at [facility].  Transported by EMS.  Discharged in stable condition.

## 2019-05-22 NOTE — Progress Notes (Signed)
  Speech Language Pathology Treatment: Dysphagia  Patient Details Name: Deborah Silva MRN: [id number] DOB: 11-Jun-1951 Today's Date: 05/22/2019 Time: 5916-3846 SLP Time Calculation (min) (ACUTE ONLY): 20 min  Assessment / Plan / Recommendation Clinical Impression  Pt continues to present with a mild to moderate oral pharyngeal dysphagia. Pt was able to intake solids and liquids with assistance with no overt ssx aspiration. Pt able to utilize large water pitcher straw with no coughing or overt ssx aspiration noted. Pt husband was in room and stated she has eaten well. Pt ate 95% morning meal with no reports of overt ssx aspiration. SLP present for medication administration. Pt able to intake meds crushed in puree with no overt ssx aspiration. Pt able to intake whole pill in puree without noted difficulty. Pt able to intake 6 oz thin liquid with medication administration without ssx aspiration. SLP will continue to follow up and continue with current diet at this time. Husband able to give verbal understanding.           SLP Plan  Continue with current plan of care       Recommendations  Diet recommendations: Dysphagia 2 (fine chop);Thin liquid Liquids provided via: Straw Medication Administration: Crushed with puree Supervision: Staff to assist with self feeding Compensations: Slow rate;Small sips/bites;Follow solids with liquid Postural Changes and/or Swallow Maneuvers: Seated upright 90 degrees                SLP Visit Diagnosis: Dysphagia, oropharyngeal phase (R13.12) Plan: Continue with current plan of care       GO                [place] 05/22/2019, 10:04 AM

## 2019-07-04 DEATH — deceased

## 2021-03-19 IMAGING — DX PORTABLE CHEST - 1 VIEW
1 series · 1 of 1 positions shown · non-contrast
Comparison: October 14, 2018

CLINICAL DATA: Lethargy.  Dementia.

EXAM:
PORTABLE CHEST 1 VIEW

[chest ap]
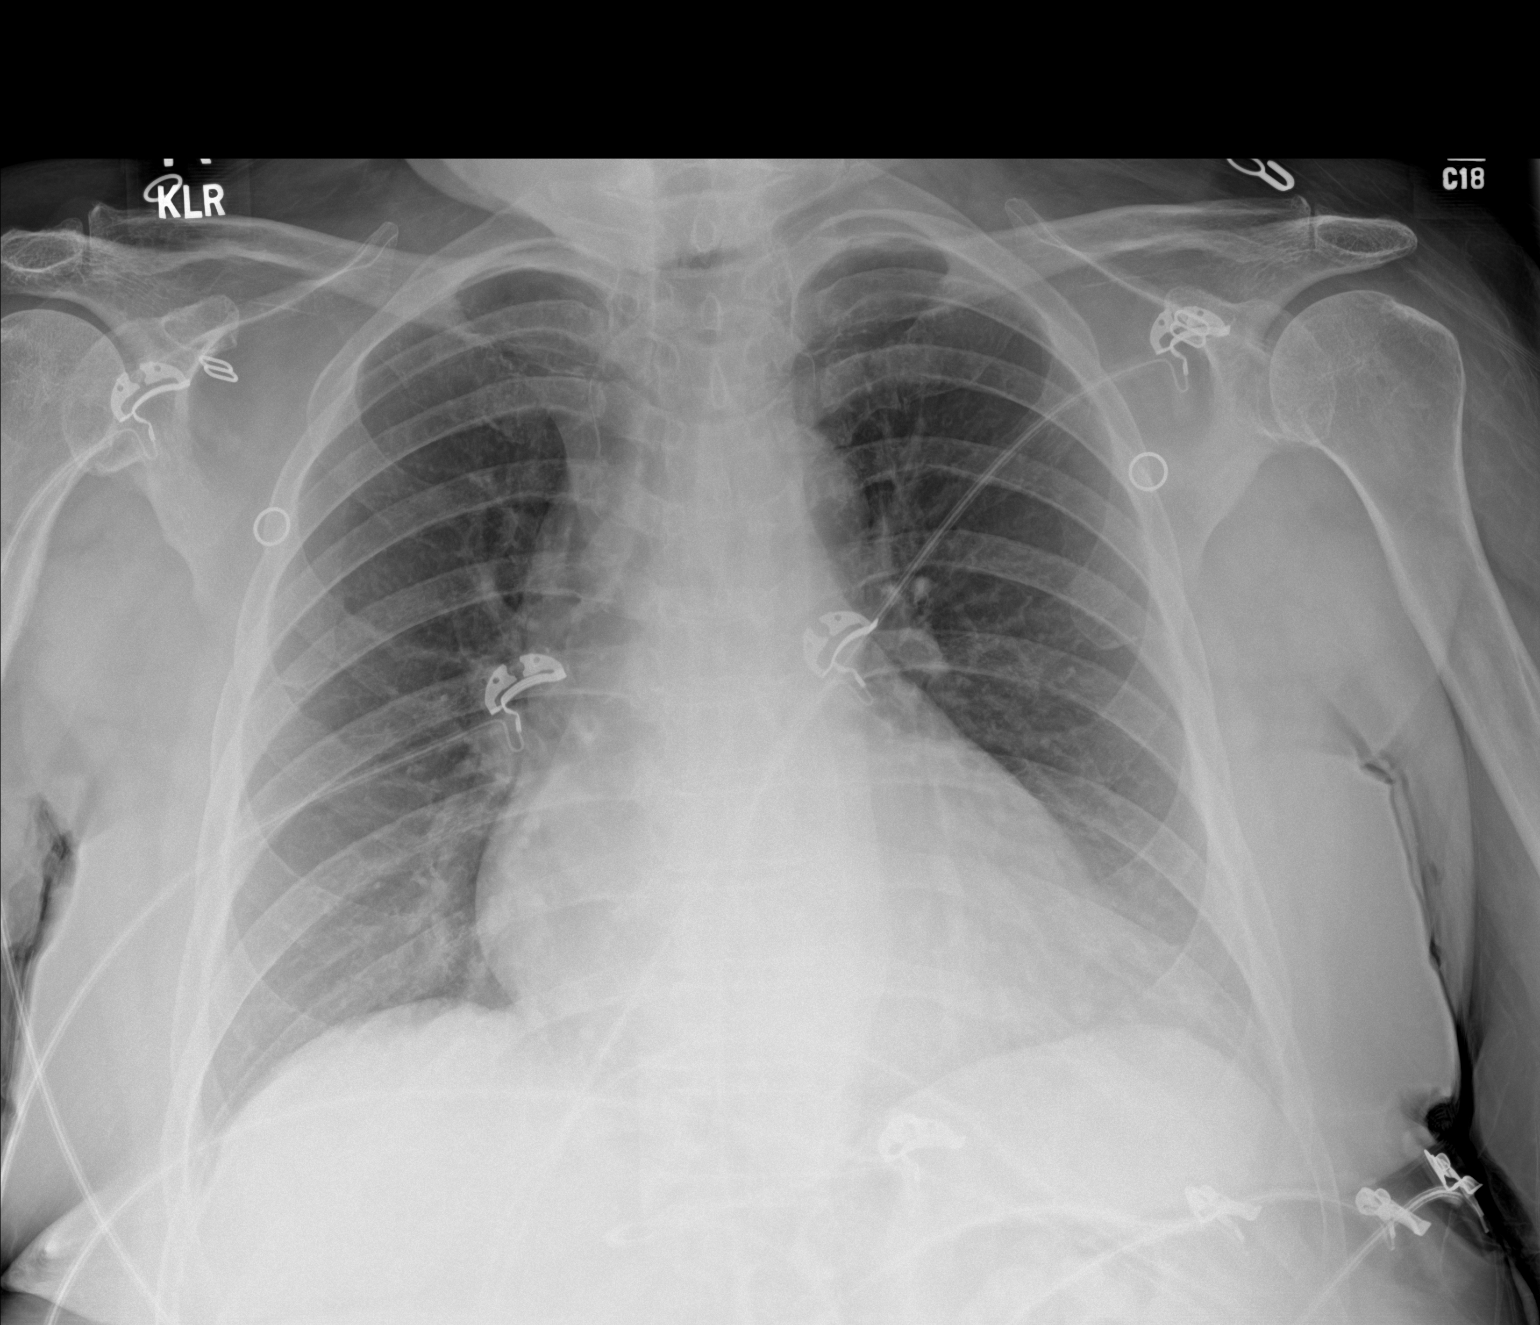

[1 of 1 positions shown; findings below may reference images not displayed]

FINDINGS: There is no edema or consolidation. Heart is borderline enlarged
with pulmonary vascularity normal. No adenopathy. No bone lesions.
IMPRESSION: Borderline cardiac enlargement.  No edema or consolidation.

## 2021-03-19 IMAGING — CT CT HEAD WITHOUT CONTRAST
3 series · 15 of 47 positions shown, 18 images · non-contrast
Comparison: March 29, 2019

CLINICAL DATA: Right-sided weakness and lethargy. Reported history
of Parkinson's disease and dementia

EXAM:
CT HEAD WITHOUT CONTRAST
TECHNIQUE: Contiguous axial images were obtained from the base of the skull
through the vertex without intravenous contrast.

[Series 3: head wo · axial · 0.42mm/px · z∈[+524,+654]mm · 9 of 32 slices shown, 12 images]
[im 3/32  brain]
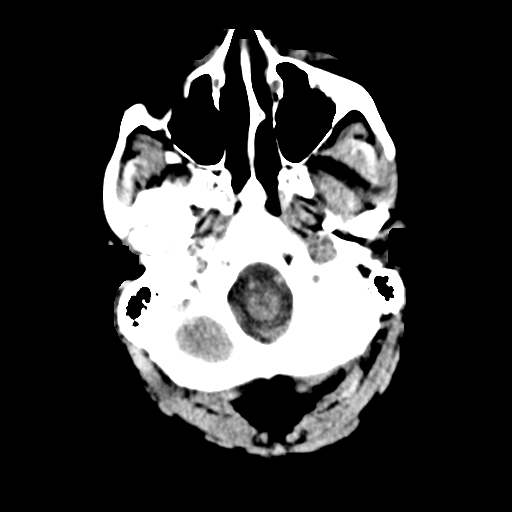
[im 3/32  bone]
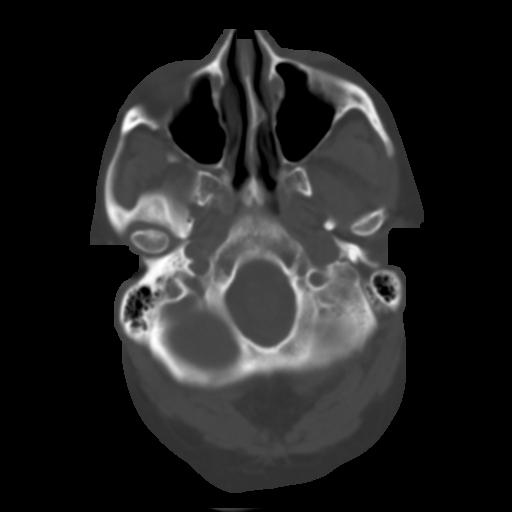
[im 6/32  brain]
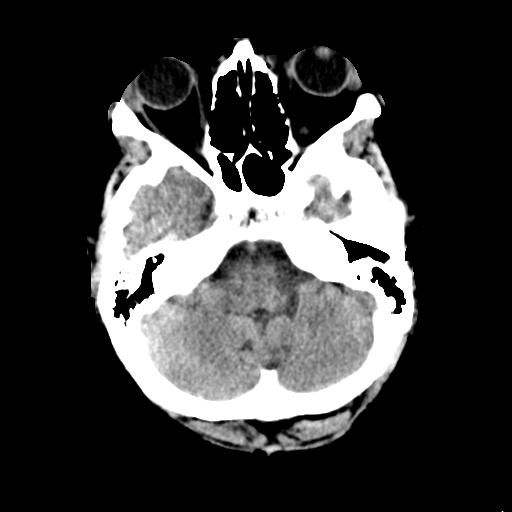
[im 9/32  brain]
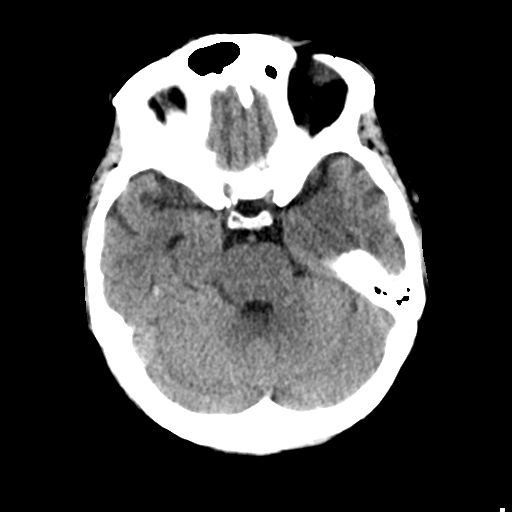
[im 12/32  brain]
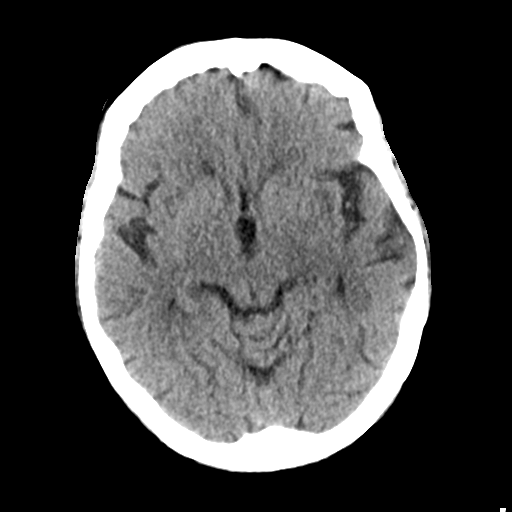
[im 17/32  brain]
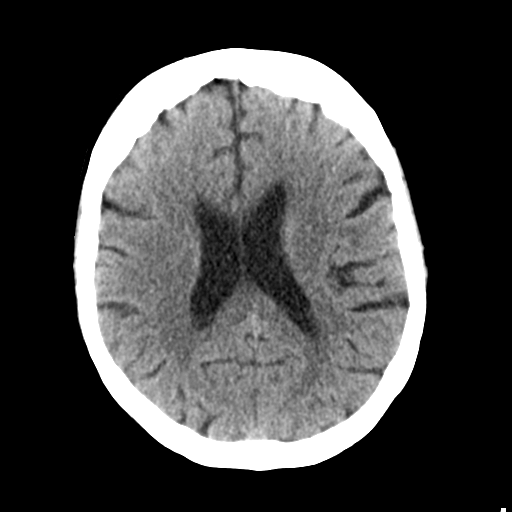
[im 17/32  bone]
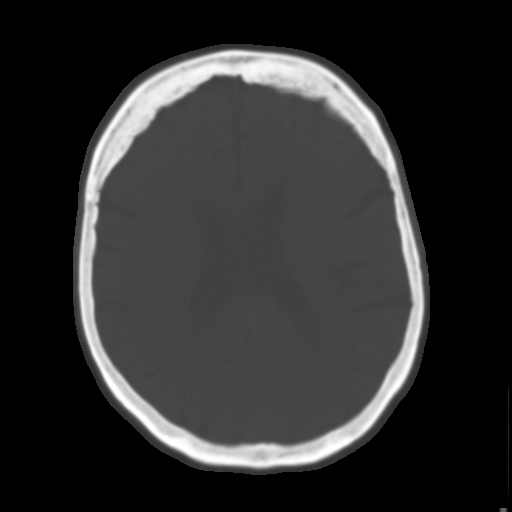
[im 20/32  brain]
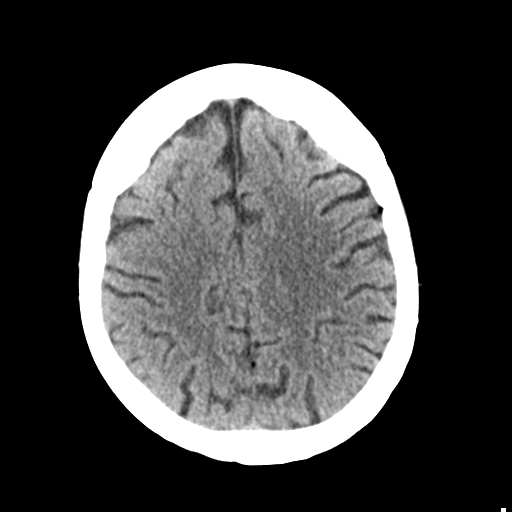
[im 23/32  brain]
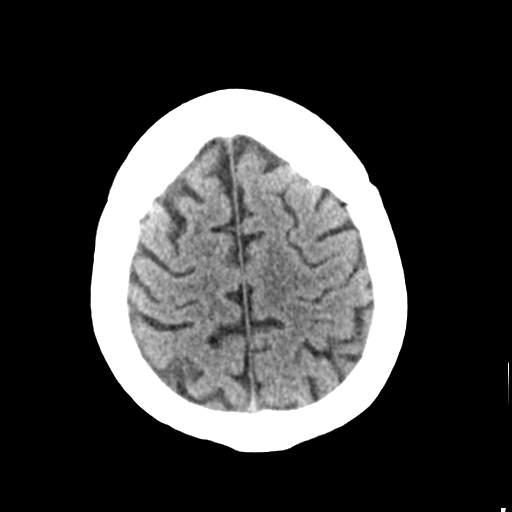
[im 26/32  brain]
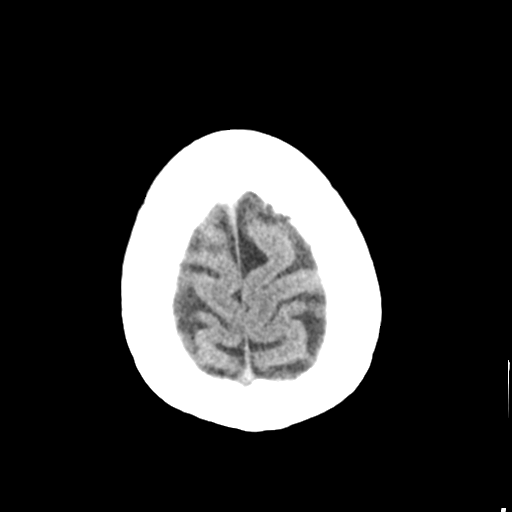
[im 29/32  brain]
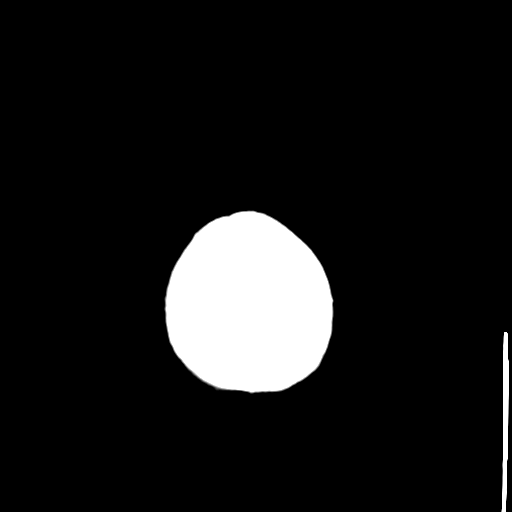
[im 29/32  bone]
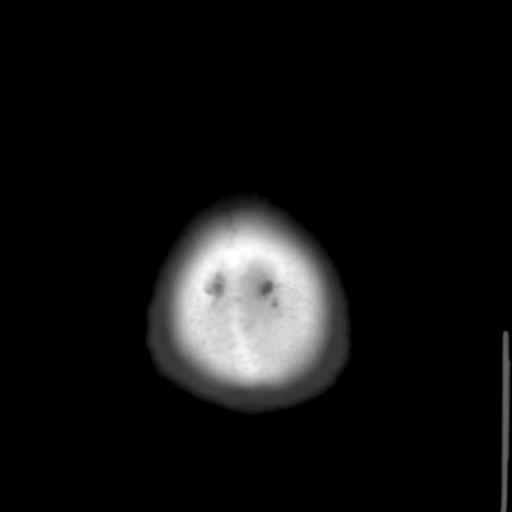

[Series 4: coronal soft tissue · coronal · 0.30mm/px · 3 of 61 slices shown]
[im 21/61  brain]
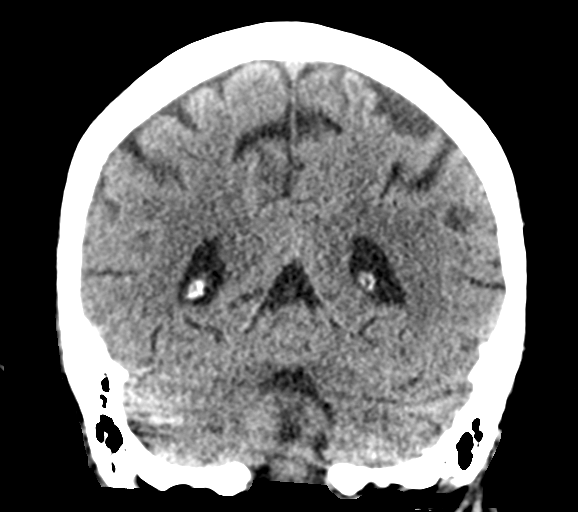
[im 27/61  brain]
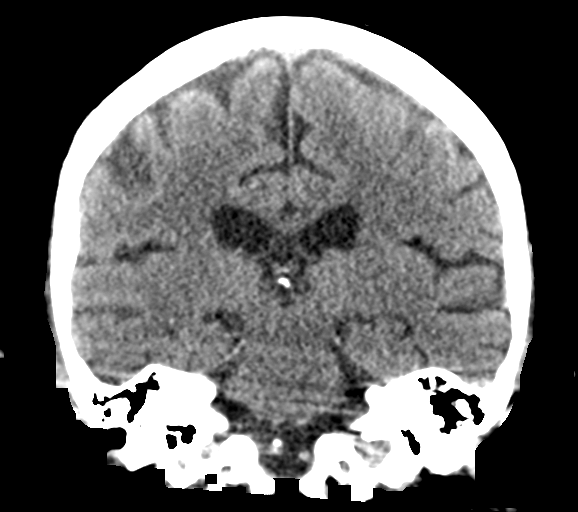
[im 34/61  brain]
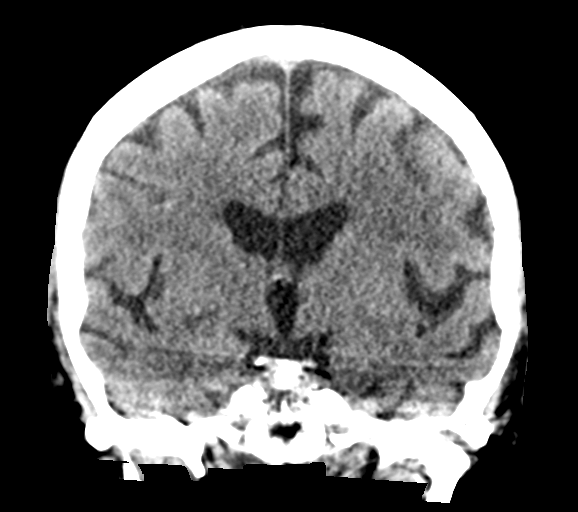

[Series 5: sagittal soft tissue · sagittal · 0.30mm/px · 3 of 53 slices shown]
[im 18/53  brain]
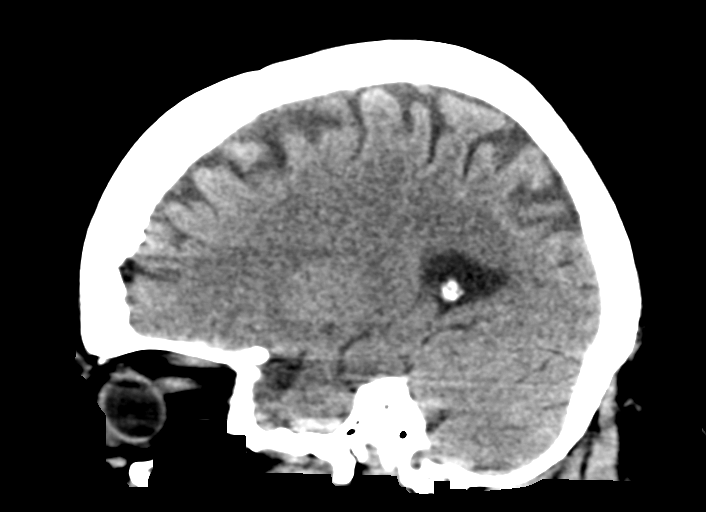
[im 27/53  brain]
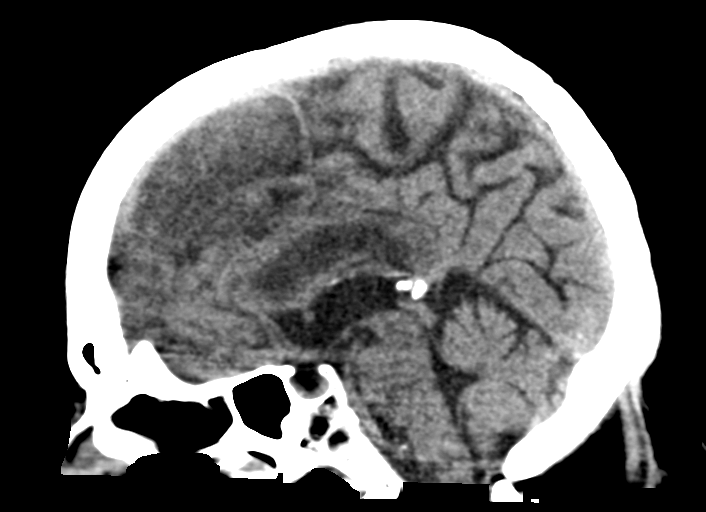
[im 35/53  brain]
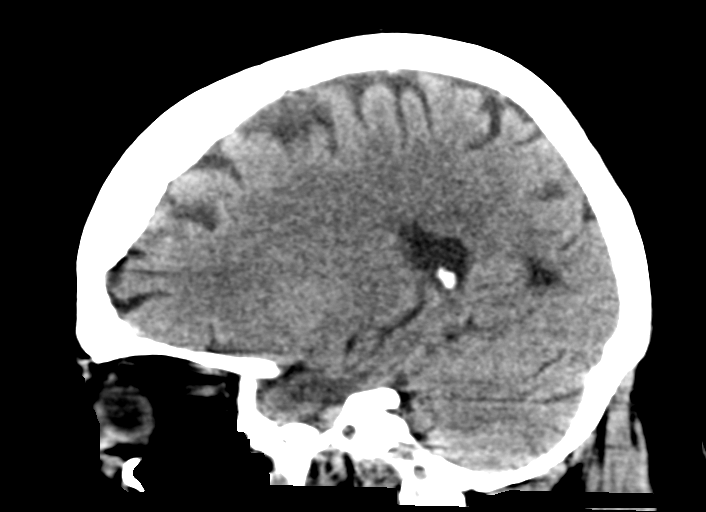

[15 of 47 positions shown; findings below may reference images not displayed]

FINDINGS: Brain: Ventricles and sulci are within normal limits for age. There
is no intracranial mass, hemorrhage, extra-axial fluid collection,
or midline shift. The brain parenchyma appears unremarkable. No
acute infarct is demonstrable.

Vascular: There is no hyperdense vessel. There is calcification in
each carotid siphon region.

Skull: The bony calvarium appears intact.

Sinuses/Orbits: Visualized paranasal sinuses are clear. Orbits
appear symmetric bilaterally.

Other: Mastoid air cells are clear.
IMPRESSION: Brain parenchyma appears unremarkable. No acute infarct. No mass or
hemorrhage.

There are foci of arterial vascular calcification.
